# Patient Record
Sex: Male | Born: 1955 | Race: White | Hispanic: No | Marital: Married
Health system: Southern US, Academic
[De-identification: ages and names within clinical notes are randomized; demographics above are authoritative.]

## PROBLEM LIST (undated history)

## (undated) ENCOUNTER — Encounter: Attending: Internal Medicine | Primary: Internal Medicine

## (undated) ENCOUNTER — Telehealth

## (undated) ENCOUNTER — Ambulatory Visit: Payer: MEDICARE | Attending: Family | Primary: Family

## (undated) ENCOUNTER — Ambulatory Visit: Payer: MEDICARE

## (undated) ENCOUNTER — Encounter: Attending: Adult Health | Primary: Adult Health

## (undated) ENCOUNTER — Encounter: Attending: Infectious Disease | Primary: Infectious Disease

## (undated) ENCOUNTER — Encounter

## (undated) ENCOUNTER — Telehealth: Attending: Family | Primary: Family

## (undated) ENCOUNTER — Ambulatory Visit: Payer: MEDICARE | Attending: Internal Medicine | Primary: Internal Medicine

## (undated) ENCOUNTER — Telehealth: Attending: Registered" | Primary: Registered"

## (undated) ENCOUNTER — Ambulatory Visit: Payer: MEDICARE | Attending: Surgery | Primary: Surgery

## (undated) ENCOUNTER — Telehealth: Attending: Pulmonary Disease | Primary: Pulmonary Disease

## (undated) ENCOUNTER — Encounter: Attending: Pulmonary Disease | Primary: Pulmonary Disease

## (undated) ENCOUNTER — Ambulatory Visit: Payer: MEDICARE | Attending: Nephrology | Primary: Nephrology

## (undated) ENCOUNTER — Ambulatory Visit: Payer: MEDICARE | Attending: Clinical | Primary: Clinical

## (undated) ENCOUNTER — Ambulatory Visit: Payer: MEDICARE | Attending: MOHS-Micrographic Surgery | Primary: MOHS-Micrographic Surgery

## (undated) ENCOUNTER — Ambulatory Visit

## (undated) ENCOUNTER — Encounter: Attending: Family | Primary: Family

## (undated) ENCOUNTER — Telehealth: Attending: Adult Health | Primary: Adult Health

## (undated) ENCOUNTER — Ambulatory Visit: Payer: MEDICARE | Attending: Registered" | Primary: Registered"

## (undated) ENCOUNTER — Encounter
Attending: Student in an Organized Health Care Education/Training Program | Primary: Student in an Organized Health Care Education/Training Program

## (undated) ENCOUNTER — Telehealth: Attending: Clinical | Primary: Clinical

## (undated) ENCOUNTER — Telehealth: Attending: Internal Medicine | Primary: Internal Medicine

## (undated) ENCOUNTER — Encounter: Attending: MOHS-Micrographic Surgery | Primary: MOHS-Micrographic Surgery

## (undated) ENCOUNTER — Telehealth: Attending: Dermatology | Primary: Dermatology

## (undated) ENCOUNTER — Telehealth: Attending: Surgery | Primary: Surgery

## (undated) ENCOUNTER — Ambulatory Visit
Attending: Student in an Organized Health Care Education/Training Program | Primary: Student in an Organized Health Care Education/Training Program

## (undated) ENCOUNTER — Ambulatory Visit: Payer: MEDICARE | Attending: Dermatology | Primary: Dermatology

## (undated) ENCOUNTER — Telehealth
Attending: Student in an Organized Health Care Education/Training Program | Primary: Student in an Organized Health Care Education/Training Program

## (undated) ENCOUNTER — Encounter: Attending: Diagnostic Radiology | Primary: Diagnostic Radiology

## (undated) DIAGNOSIS — D649 Anemia, unspecified: Secondary | ICD-10-CM

## (undated) DIAGNOSIS — F419 Anxiety disorder, unspecified: Secondary | ICD-10-CM

## (undated) DIAGNOSIS — Z942 Lung transplant status: Secondary | ICD-10-CM

## (undated) DIAGNOSIS — F32A Depression, unspecified: Secondary | ICD-10-CM

## (undated) DIAGNOSIS — T7840XA Allergy, unspecified, initial encounter: Secondary | ICD-10-CM

## (undated) DIAGNOSIS — H269 Unspecified cataract: Secondary | ICD-10-CM

## (undated) DIAGNOSIS — Z5189 Encounter for other specified aftercare: Secondary | ICD-10-CM

## (undated) DIAGNOSIS — N189 Chronic kidney disease, unspecified: Secondary | ICD-10-CM

## (undated) DIAGNOSIS — I10 Essential (primary) hypertension: Secondary | ICD-10-CM

## (undated) DIAGNOSIS — K219 Gastro-esophageal reflux disease without esophagitis: Secondary | ICD-10-CM

## (undated) DIAGNOSIS — J449 Chronic obstructive pulmonary disease, unspecified: Secondary | ICD-10-CM

## (undated) HISTORY — DX: Anxiety disorder, unspecified: F41.9

## (undated) HISTORY — PX: LUNG TRANSPLANT: SHX234

## (undated) HISTORY — DX: Depression, unspecified: F32.A

## (undated) HISTORY — DX: Anemia, unspecified: D64.9

## (undated) HISTORY — DX: Chronic obstructive pulmonary disease, unspecified: J44.9

## (undated) HISTORY — DX: Chronic kidney disease, unspecified: N18.9

## (undated) HISTORY — DX: Essential (primary) hypertension: I10

## (undated) HISTORY — DX: Allergy, unspecified, initial encounter: T78.40XA

## (undated) HISTORY — PX: OTHER SURGICAL HISTORY: SHX169

## (undated) HISTORY — DX: Encounter for other specified aftercare: Z51.89

## (undated) HISTORY — DX: Lung transplant status: Z94.2

## (undated) HISTORY — DX: Gastro-esophageal reflux disease without esophagitis: K21.9

## (undated) HISTORY — DX: Unspecified cataract: H26.9

---
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
## 2012-02-11 ENCOUNTER — Ambulatory Visit: Payer: Self-pay | Admitting: Specialist

## 2012-03-03 ENCOUNTER — Ambulatory Visit: Payer: Self-pay | Admitting: Specialist

## 2012-03-22 ENCOUNTER — Ambulatory Visit: Payer: Self-pay | Admitting: Cardiothoracic Surgery

## 2012-04-26 ENCOUNTER — Ambulatory Visit: Payer: Self-pay | Admitting: Cardiothoracic Surgery

## 2012-10-31 ENCOUNTER — Encounter: Payer: Self-pay | Admitting: Pulmonary Disease

## 2012-11-02 ENCOUNTER — Other Ambulatory Visit: Payer: Self-pay | Admitting: Pulmonary Disease

## 2012-11-02 LAB — COMPREHENSIVE METABOLIC PANEL
Albumin: 3.3 g/dL — ABNORMAL LOW (ref 3.4–5.0)
Alkaline Phosphatase: 85 U/L (ref 50–136)
Anion Gap: 9 (ref 7–16)
Creatinine: 1.1 mg/dL (ref 0.60–1.30)
EGFR (African American): >60
EGFR (Non-African Amer.): >60
Glucose: 110 mg/dL — ABNORMAL HIGH (ref 65–99)
Potassium: 3.2 mmol/L — ABNORMAL LOW (ref 3.5–5.1)
SGOT(AST): 15 U/L (ref 15–37)
SGPT (ALT): 20 U/L (ref 12–78)
Sodium: 140 mmol/L (ref 136–145)

## 2012-11-02 LAB — CBC WITH DIFFERENTIAL/PLATELET
Basophil #: 0.1 10*3/uL (ref 0.0–0.1)
Basophil %: 1 %
Eosinophil #: 0.1 10*3/uL (ref 0.0–0.7)
Eosinophil %: 1.1 %
HCT: 46.9 % (ref 40.0–52.0)
Lymphocyte %: 22.6 %
MCHC: 34.5 g/dL (ref 32.0–36.0)
Monocyte %: 6.8 %
Neutrophil #: 7 10*3/uL — ABNORMAL HIGH (ref 1.4–6.5)
Neutrophil %: 68.5 %
Platelet: 259 10*3/uL (ref 150–440)
RBC: 4.69 10*6/uL (ref 4.40–5.90)
RDW: 15.1 % — ABNORMAL HIGH (ref 11.5–14.5)
WBC: 10.2 10*3/uL (ref 3.8–10.6)

## 2012-11-20 ENCOUNTER — Other Ambulatory Visit: Payer: Self-pay | Admitting: Pulmonary Disease

## 2012-11-20 ENCOUNTER — Encounter: Payer: Self-pay | Admitting: Pulmonary Disease

## 2012-11-27 LAB — COMPREHENSIVE METABOLIC PANEL WITH GFR
Albumin: 3.8 g/dL
Alkaline Phosphatase: 79 U/L
Anion Gap: 7
BUN: 10 mg/dL
Bilirubin,Total: 0.7 mg/dL
Calcium, Total: 9.7 mg/dL
Chloride: 105 mmol/L
Co2: 27 mmol/L
Creatinine: 1.01 mg/dL
EGFR (African American): >60
EGFR (Non-African Amer.): >60
Glucose: 109 mg/dL — ABNORMAL HIGH
Osmolality: 277
Potassium: 3.9 mmol/L
SGOT(AST): 19 U/L
SGPT (ALT): 27 U/L
Sodium: 139 mmol/L
Total Protein: 7.3 g/dL

## 2012-11-27 LAB — CBC WITH DIFFERENTIAL/PLATELET
Basophil #: 0.1 x10 3/mm 3
Basophil %: 0.5 %
Eosinophil #: 0 x10 3/mm 3
Eosinophil %: 0 %
HCT: 48.4 %
HGB: 16.6 g/dL
Lymphocyte %: 8.6 %
Lymphs Abs: 1.1 x10 3/mm 3
MCH: 34.2 pg — ABNORMAL HIGH
MCHC: 34.4 g/dL
MCV: 100 fL
Monocyte #: 0.3 "x10 3/mm "
Monocyte %: 2 %
Neutrophil #: 11.3 x10 3/mm 3 — ABNORMAL HIGH
Neutrophil %: 88.9 %
Platelet: 290 x10 3/mm 3
RBC: 4.85 x10 6/mm 3
RDW: 14.3 %
WBC: 12.7 x10 3/mm 3 — ABNORMAL HIGH

## 2012-12-19 LAB — COMPREHENSIVE METABOLIC PANEL
Anion Gap: 8 (ref 7–16)
BUN: 8 mg/dL (ref 7–18)
Bilirubin,Total: 0.6 mg/dL (ref 0.2–1.0)
Calcium, Total: 9.3 mg/dL (ref 8.5–10.1)
EGFR (Non-African Amer.): >60
Potassium: 3.9 mmol/L (ref 3.5–5.1)
SGOT(AST): 24 U/L (ref 15–37)
SGPT (ALT): 26 U/L (ref 12–78)
Sodium: 138 mmol/L (ref 136–145)
Total Protein: 7.5 g/dL (ref 6.4–8.2)

## 2012-12-19 LAB — CBC WITH DIFFERENTIAL/PLATELET
Basophil %: 0.8 %
Eosinophil #: 0 10*3/uL (ref 0.0–0.7)
Eosinophil %: 0 %
HGB: 16.2 g/dL (ref 13.0–18.0)
Lymphocyte %: 12.5 %
MCH: 34.2 pg — ABNORMAL HIGH (ref 26.0–34.0)
MCHC: 34.8 g/dL (ref 32.0–36.0)
Monocyte %: 4.2 %
Neutrophil #: 7.5 10*3/uL — ABNORMAL HIGH (ref 1.4–6.5)
Neutrophil %: 82.5 %
RBC: 4.73 10*6/uL (ref 4.40–5.90)
RDW: 13.7 % (ref 11.5–14.5)
WBC: 9.1 10*3/uL (ref 3.8–10.6)

## 2012-12-20 ENCOUNTER — Encounter: Payer: Self-pay | Admitting: Pulmonary Disease

## 2012-12-20 ENCOUNTER — Other Ambulatory Visit: Payer: Self-pay | Admitting: Pulmonary Disease

## 2013-01-20 ENCOUNTER — Encounter: Payer: Self-pay | Admitting: Pulmonary Disease

## 2013-02-19 ENCOUNTER — Encounter: Payer: Self-pay | Admitting: Pulmonary Disease

## 2013-03-22 ENCOUNTER — Encounter: Payer: Self-pay | Admitting: Pulmonary Disease

## 2013-04-22 ENCOUNTER — Encounter: Payer: Self-pay | Admitting: Pulmonary Disease

## 2013-05-20 ENCOUNTER — Encounter: Payer: Self-pay | Admitting: Pulmonary Disease

## 2013-06-20 ENCOUNTER — Encounter: Payer: Self-pay | Admitting: Pulmonary Disease

## 2013-07-20 ENCOUNTER — Encounter: Payer: Self-pay | Admitting: Pulmonary Disease

## 2013-08-20 ENCOUNTER — Encounter: Payer: Self-pay | Admitting: Pulmonary Disease

## 2013-09-19 ENCOUNTER — Encounter: Payer: Self-pay | Admitting: Pulmonary Disease

## 2013-10-20 ENCOUNTER — Encounter: Payer: Self-pay | Admitting: Pulmonary Disease

## 2013-11-20 ENCOUNTER — Encounter: Payer: Self-pay | Admitting: Pulmonary Disease

## 2013-12-14 ENCOUNTER — Ambulatory Visit: Payer: Self-pay | Admitting: Gastroenterology

## 2013-12-17 LAB — PATHOLOGY REPORT

## 2013-12-20 ENCOUNTER — Encounter: Payer: Self-pay | Admitting: Pulmonary Disease

## 2014-01-20 ENCOUNTER — Encounter: Payer: Self-pay | Admitting: Pulmonary Disease

## 2014-02-19 ENCOUNTER — Encounter: Payer: Self-pay | Admitting: Pulmonary Disease

## 2014-03-22 ENCOUNTER — Encounter: Payer: Self-pay | Admitting: Pulmonary Disease

## 2014-04-22 ENCOUNTER — Encounter: Payer: Self-pay | Admitting: Pulmonary Disease

## 2014-05-21 ENCOUNTER — Encounter
Admit: 2014-05-21 | Disposition: A | Discharge status: Home / Self Care | Payer: Self-pay | Attending: Pulmonary Disease | Admitting: Pulmonary Disease

## 2014-06-21 ENCOUNTER — Encounter
Admit: 2014-06-21 | Disposition: A | Discharge status: Home / Self Care | Payer: Self-pay | Attending: Pulmonary Disease | Admitting: Pulmonary Disease

## 2014-07-22 ENCOUNTER — Ambulatory Visit: Payer: Self-pay

## 2014-07-24 ENCOUNTER — Ambulatory Visit: Payer: Self-pay

## 2014-07-26 ENCOUNTER — Ambulatory Visit: Payer: Self-pay

## 2014-07-29 ENCOUNTER — Ambulatory Visit: Payer: Self-pay

## 2014-07-31 ENCOUNTER — Ambulatory Visit: Payer: Self-pay

## 2014-08-02 ENCOUNTER — Ambulatory Visit: Payer: Self-pay

## 2014-08-05 ENCOUNTER — Ambulatory Visit: Payer: Self-pay

## 2014-08-07 ENCOUNTER — Ambulatory Visit: Payer: Self-pay

## 2014-08-09 ENCOUNTER — Ambulatory Visit: Payer: Self-pay

## 2014-08-12 ENCOUNTER — Ambulatory Visit: Payer: Self-pay

## 2014-08-14 ENCOUNTER — Ambulatory Visit: Payer: Self-pay

## 2014-08-16 ENCOUNTER — Ambulatory Visit: Payer: Self-pay

## 2014-08-21 ENCOUNTER — Ambulatory Visit: Payer: Self-pay

## 2014-08-23 ENCOUNTER — Ambulatory Visit: Payer: Self-pay

## 2014-08-26 ENCOUNTER — Ambulatory Visit: Payer: Self-pay

## 2014-08-28 ENCOUNTER — Ambulatory Visit: Payer: Self-pay

## 2014-08-30 ENCOUNTER — Ambulatory Visit: Payer: Self-pay

## 2014-09-02 ENCOUNTER — Ambulatory Visit: Payer: Self-pay

## 2014-09-04 ENCOUNTER — Ambulatory Visit: Payer: Self-pay

## 2014-09-06 ENCOUNTER — Ambulatory Visit: Payer: Self-pay

## 2014-09-09 ENCOUNTER — Ambulatory Visit: Payer: Self-pay

## 2014-09-11 ENCOUNTER — Ambulatory Visit: Payer: Self-pay

## 2014-09-13 ENCOUNTER — Ambulatory Visit: Payer: Self-pay

## 2014-09-16 ENCOUNTER — Ambulatory Visit: Payer: Self-pay

## 2014-09-18 ENCOUNTER — Ambulatory Visit: Payer: Self-pay

## 2014-09-20 ENCOUNTER — Ambulatory Visit: Payer: Self-pay

## 2014-11-30 IMAGING — CT NM PET TUM IMG LTD AREA
1 of 5 series · 9 of 25 positions shown · non-contrast
Comparison: none

REASON FOR EXAM: Abnormal CT  Rt upper lobe nodule
COMMENTS:

[Series 3: ct wb 3.0 b30f · axial · 3.0mm · 0.98mm/px · z∈[-336,+358]mm · 9 of 435 slices shown]
[im 44/435  soft-tissue]
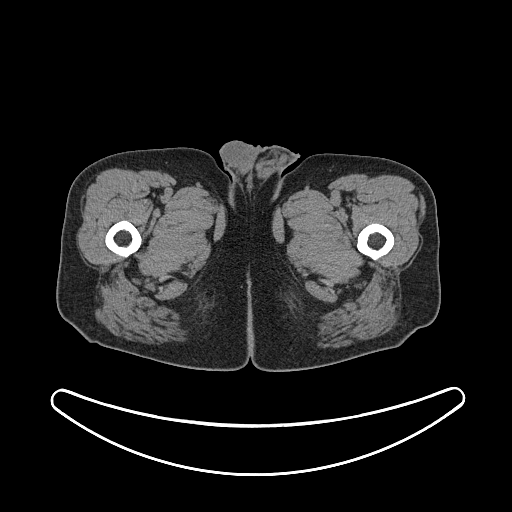
[im 87/435  soft-tissue]
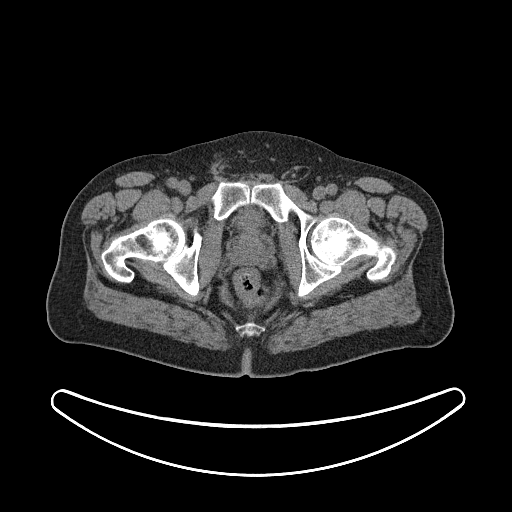
[im 131/435  soft-tissue]
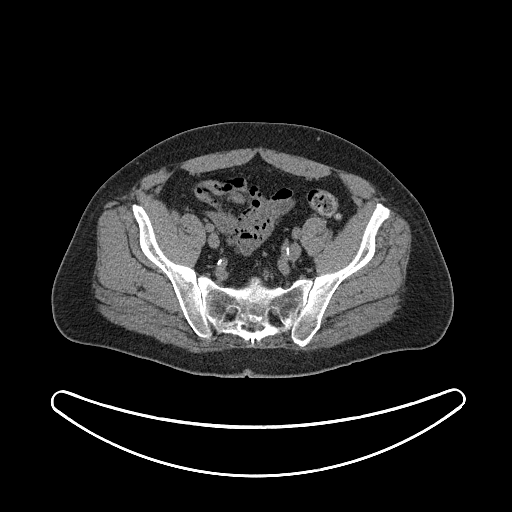
[im 174/435  soft-tissue]
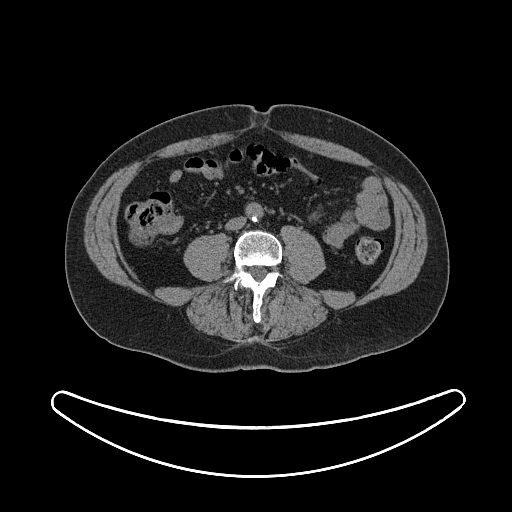
[im 218/435  soft-tissue]
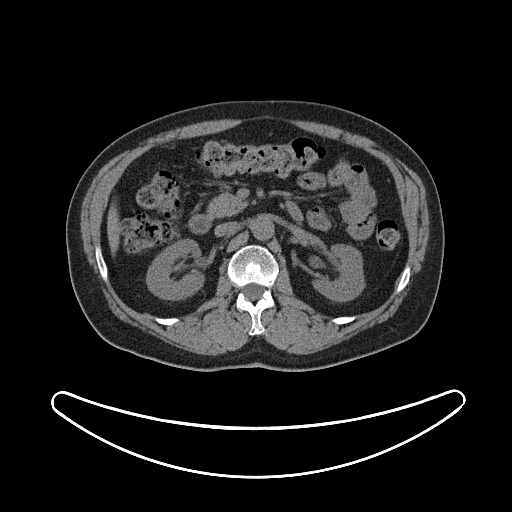
[im 261/435  soft-tissue]
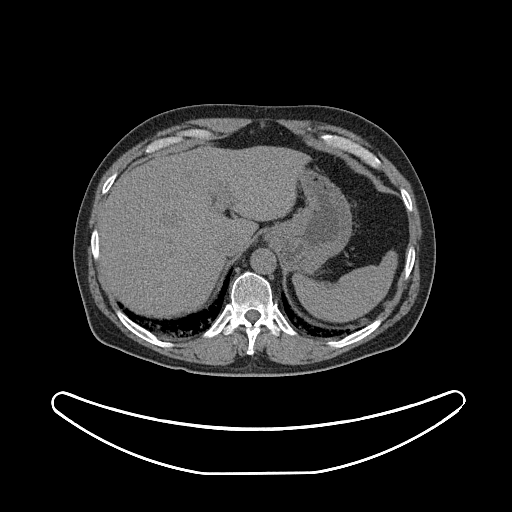
[im 304/435  soft-tissue]
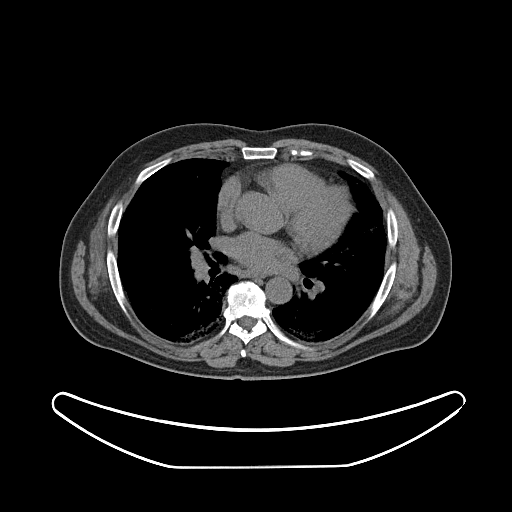
[im 348/435  soft-tissue]
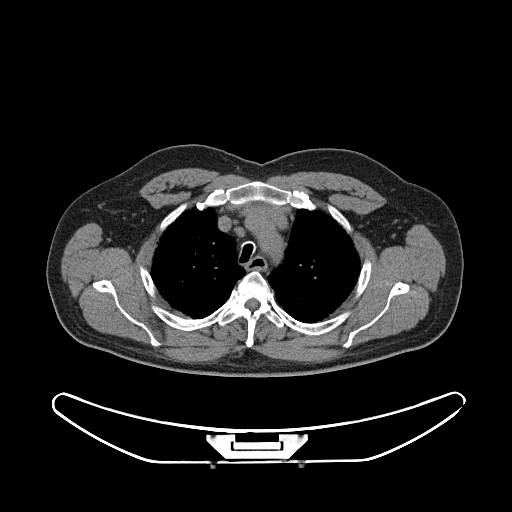
[im 391/435  soft-tissue]
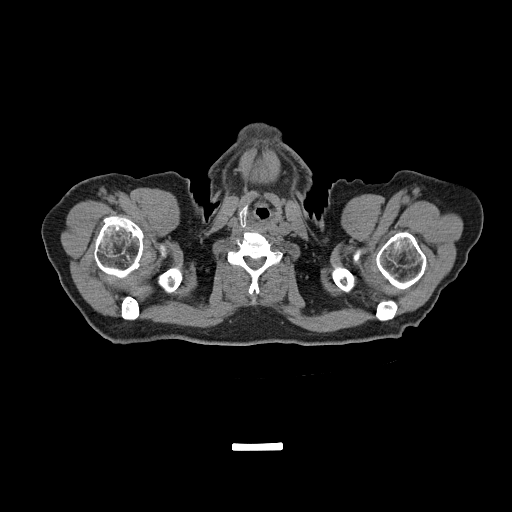

[9 of 25 positions shown; findings below may reference images not displayed]

PROCEDURE:     PET - PET/CT DX LUNG CA  - March 03, 2012 [DATE]

RESULT:

The patient has a fasting blood glucose level measuring 92 mg/dl. The
patient received a dose of 12.47 mCi of F18-FDG in the left antecubital
region at [DATE] a.m. with imaging obtained from the base of the brain into
the mid thighs between the hours of [DATE] a.m. and [DATE] a.m. A low dose
noncontrast CT is performed over the same area as the PET acquisition and is
utilized for attenuation correction and fusion. The low dose noncontrast CT,
attenuation corrected PET images and fused PET/CT data are reconstructed in
the axial, coronal and sagittal planes by the [employer] software. A rotating
3 dimensional maximum intensity pixel projection image is also created.

There is no previous exam for comparison.

The smoothly marginated area of pulmonary nodularity in the right upper lobe
shows an SUV of 2.13 maximum with a mean of 1.58 SUV similar to the
mediastinum. There is some increased localization in the subpleural right
lower lobe. The CT suggests areas of dependent atelectasis and/or fibrosis
at this level bilaterally. Atelectasis with some inflammation or early
infiltrate is felt to be most likely. The possibility of developing or
minimal malignancy in that region is not excluded. This area demonstrates a
maximum SUV of 6.31 with a mean of 3.9 SUV. There is focal increased
localization in the region of the arytenoid on the right with a maximum SUV
of 8.47 and a mean of 5.56. Correlate with direct visualization. GI and GU
localization appears normal.

Mediastinal adenopathy is present especially in the precarinal and AP window
region. No abnormal accumulation of F18-FDG is demonstrated.
IMPRESSION: 1.  No significant abnormal F18-FDG accumulation in the nodular density in
the right upper lobe. Focal abnormal increased localization in the
subpleural to pleural region of the right lower lobe which is nonspecific. A
definite accompanying mass is not appreciated. There is significant
underlying emphysematous lung disease and a focal infectious or inflammatory
etiology is not excluded. Correlate clinically. Follow-up CT evaluation is
recommended. A nonR8-FDG avid malignancy is not excluded.
2.  Abnormal focal localization in the right arytenoid region. Correlate
with direct visualization to assess for underlying malignant disease.
3.  Enlarged mediastinal lymph nodes especially in the precarinal and AP
window region. No abnormal F18-FDG accumulation evident.

[REDACTED]

## 2014-12-24 ENCOUNTER — Ambulatory Visit: Payer: BLUE CROSS/BLUE SHIELD

## 2016-09-23 MED ORDER — TACROLIMUS 1 MG CAPSULE
ORAL_CAPSULE | Freq: Two times a day (BID) | ORAL | 3 refills | 0.00000 days
Start: 2016-09-23 — End: 2016-10-08

## 2016-09-23 MED ORDER — TACROLIMUS 0.5 MG CAPSULE
ORAL_CAPSULE | Freq: Every day | ORAL | 3 refills | 0.00000 days | Status: CP
Start: 2016-09-23 — End: 2016-10-08

## 2016-10-04 ENCOUNTER — Ambulatory Visit: Admission: RE | Admit: 2016-10-04 | Discharge: 2016-10-04 | Payer: MEDICARE

## 2016-10-08 ENCOUNTER — Ambulatory Visit
Admission: RE | Admit: 2016-10-08 | Discharge: 2016-10-08 | Disposition: A | Discharge status: Home / Self Care | Payer: MEDICARE

## 2016-10-08 DIAGNOSIS — Z942 Lung transplant status: Principal | ICD-10-CM

## 2016-10-08 MED ORDER — TACROLIMUS 1 MG CAPSULE
ORAL_CAPSULE | Freq: Two times a day (BID) | ORAL | 3 refills | 0 days
Start: 2016-10-08 — End: 2016-10-15

## 2016-10-08 MED ORDER — GABAPENTIN 400 MG CAPSULE
ORAL_CAPSULE | 3 refills | 0 days | Status: CP
Start: 2016-10-08 — End: 2016-11-19

## 2016-10-08 MED ORDER — TACROLIMUS 0.5 MG CAPSULE
ORAL_CAPSULE | Freq: Two times a day (BID) | ORAL | 3 refills | 0 days
Start: 2016-10-08 — End: 2016-10-20

## 2016-10-14 ENCOUNTER — Ambulatory Visit
Admission: RE | Admit: 2016-10-14 | Discharge: 2016-10-14 | Disposition: A | Discharge status: Home / Self Care | Payer: MEDICARE

## 2016-10-14 DIAGNOSIS — Z942 Lung transplant status: Principal | ICD-10-CM

## 2016-10-15 MED ORDER — TACROLIMUS 1 MG CAPSULE
ORAL_CAPSULE | 3 refills | 0 days | Status: CP
Start: 2016-10-15 — End: 2016-10-20

## 2016-10-19 ENCOUNTER — Ambulatory Visit
Admission: RE | Admit: 2016-10-19 | Discharge: 2016-10-19 | Disposition: A | Discharge status: Home / Self Care | Payer: MEDICARE | Attending: Registered Nurse | Admitting: Registered Nurse

## 2016-10-19 ENCOUNTER — Ambulatory Visit
Admission: RE | Admit: 2016-10-19 | Discharge: 2016-10-19 | Disposition: A | Discharge status: Home / Self Care | Payer: MEDICARE | Attending: Ophthalmology | Admitting: Ophthalmology

## 2016-10-19 DIAGNOSIS — H2512 Age-related nuclear cataract, left eye: Principal | ICD-10-CM

## 2016-10-19 MED ORDER — PREDNISOLONE ACETATE 1 % EYE DROPS,SUSPENSION
Freq: Four times a day (QID) | OPHTHALMIC | 1 refills | 0 days | Status: CP
Start: 2016-10-19 — End: 2016-11-09

## 2016-10-19 MED ORDER — LEVOFLOXACIN 0.5 % EYE DROPS
Freq: Four times a day (QID) | OPHTHALMIC | 0 refills | 0.00000 days | Status: CP
Start: 2016-10-19 — End: 2016-10-29

## 2016-10-20 ENCOUNTER — Ambulatory Visit: Admission: RE | Admit: 2016-10-20 | Discharge: 2016-10-20 | Payer: MEDICARE

## 2016-10-20 DIAGNOSIS — H2513 Age-related nuclear cataract, bilateral: Principal | ICD-10-CM

## 2016-10-20 MED ORDER — TACROLIMUS 1 MG CAPSULE
ORAL_CAPSULE | 3 refills | 0 days | Status: CP
Start: 2016-10-20 — End: 2016-10-22

## 2016-10-20 MED ORDER — TACROLIMUS 0.5 MG CAPSULE
ORAL_CAPSULE | 3 refills | 0 days | Status: CP
Start: 2016-10-20 — End: 2016-10-26

## 2016-10-21 ENCOUNTER — Ambulatory Visit
Admission: RE | Admit: 2016-10-21 | Discharge: 2016-10-21 | Disposition: A | Discharge status: Home / Self Care | Payer: MEDICARE

## 2016-10-21 DIAGNOSIS — Z942 Lung transplant status: Principal | ICD-10-CM

## 2016-10-22 MED ORDER — TACROLIMUS 1 MG CAPSULE
ORAL_CAPSULE | Freq: Two times a day (BID) | ORAL | 3 refills | 0 days | Status: CP
Start: 2016-10-22 — End: 2016-11-18

## 2016-10-26 MED ORDER — TACROLIMUS 0.5 MG CAPSULE
ORAL_CAPSULE | 3 refills | 0 days | Status: CP
Start: 2016-10-26 — End: 2016-11-23

## 2016-10-28 ENCOUNTER — Ambulatory Visit
Admission: RE | Admit: 2016-10-28 | Discharge: 2016-10-28 | Disposition: A | Discharge status: Home / Self Care | Payer: MEDICARE

## 2016-10-28 DIAGNOSIS — Z942 Lung transplant status: Secondary | ICD-10-CM

## 2016-10-28 DIAGNOSIS — Z949 Transplanted organ and tissue status, unspecified: Principal | ICD-10-CM

## 2016-10-29 ENCOUNTER — Ambulatory Visit: Admission: RE | Admit: 2016-10-29 | Discharge: 2016-10-29 | Payer: MEDICARE

## 2016-10-29 DIAGNOSIS — H2512 Age-related nuclear cataract, left eye: Principal | ICD-10-CM

## 2016-11-04 ENCOUNTER — Ambulatory Visit
Admission: RE | Admit: 2016-11-04 | Discharge: 2016-11-04 | Disposition: A | Discharge status: Home / Self Care | Payer: MEDICARE

## 2016-11-04 DIAGNOSIS — Z942 Lung transplant status: Principal | ICD-10-CM

## 2016-11-09 ENCOUNTER — Ambulatory Visit
Admission: RE | Admit: 2016-11-09 | Discharge: 2016-11-09 | Disposition: A | Discharge status: Home / Self Care | Payer: MEDICARE | Attending: Pulmonary Disease | Admitting: Pulmonary Disease

## 2016-11-09 ENCOUNTER — Ambulatory Visit
Admission: RE | Admit: 2016-11-09 | Discharge: 2016-11-09 | Disposition: A | Discharge status: Home / Self Care | Payer: MEDICARE

## 2016-11-09 DIAGNOSIS — J984 Other disorders of lung: Principal | ICD-10-CM

## 2016-11-09 DIAGNOSIS — Z942 Lung transplant status: Principal | ICD-10-CM

## 2016-11-18 ENCOUNTER — Ambulatory Visit
Admission: RE | Admit: 2016-11-18 | Discharge: 2016-11-18 | Disposition: A | Discharge status: Home / Self Care | Payer: MEDICARE

## 2016-11-18 DIAGNOSIS — Z942 Lung transplant status: Principal | ICD-10-CM

## 2016-11-18 MED ORDER — TACROLIMUS 1 MG CAPSULE
ORAL_CAPSULE | 3 refills | 0 days
Start: 2016-11-18 — End: 2016-11-23

## 2016-11-18 MED ORDER — MYCOPHENOLATE MOFETIL 250 MG CAPSULE
ORAL_CAPSULE | Freq: Two times a day (BID) | ORAL | 3 refills | 0 days | Status: CP
Start: 2016-11-18 — End: 2016-11-19

## 2016-11-19 ENCOUNTER — Ambulatory Visit
Admission: RE | Admit: 2016-11-19 | Discharge: 2016-11-19 | Disposition: A | Discharge status: Home / Self Care | Payer: MEDICARE

## 2016-11-19 DIAGNOSIS — Z942 Lung transplant status: Principal | ICD-10-CM

## 2016-11-19 DIAGNOSIS — R7989 Other specified abnormal findings of blood chemistry: Principal | ICD-10-CM

## 2016-11-19 MED ORDER — GABAPENTIN 400 MG CAPSULE
ORAL_CAPSULE | Freq: Every evening | ORAL | 3 refills | 0 days | Status: CP
Start: 2016-11-19 — End: 2017-08-30

## 2016-11-19 MED ORDER — PREDNISONE 10 MG TABLET
ORAL_TABLET | Freq: Every day | ORAL | 3 refills | 0.00000 days | Status: CP
Start: 2016-11-19 — End: 2017-01-06

## 2016-11-19 MED ORDER — HYDROCHLOROTHIAZIDE 25 MG TABLET
ORAL_TABLET | Freq: Every day | ORAL | 3 refills | 0.00000 days | Status: CP
Start: 2016-11-19 — End: 2017-09-21

## 2016-11-19 MED ORDER — PRAVASTATIN 20 MG TABLET
ORAL_TABLET | 3 refills | 0 days | Status: CP
Start: 2016-11-19 — End: 2017-08-30

## 2016-11-19 MED ORDER — MAGNESIUM OXIDE 400 MG (241.3 MG MAGNESIUM) TABLET
ORAL_TABLET | Freq: Every day | ORAL | 1 refills | 0.00000 days
Start: 2016-11-19 — End: 2017-08-30

## 2016-11-19 MED ORDER — MYCOPHENOLATE MOFETIL 250 MG CAPSULE
ORAL_CAPSULE | Freq: Two times a day (BID) | ORAL | 3 refills | 0.00000 days | Status: CP
Start: 2016-11-19 — End: 2017-02-14

## 2016-11-19 MED ORDER — CHOLECALCIFEROL (VITAMIN D3) 25 MCG (1,000 UNIT) CAPSULE
ORAL_CAPSULE | Freq: Two times a day (BID) | ORAL | 11 refills | 0.00000 days
Start: 2016-11-19 — End: 2017-08-30

## 2016-11-19 MED ORDER — OMEPRAZOLE 40 MG CAPSULE,DELAYED RELEASE
ORAL_CAPSULE | Freq: Every day | ORAL | 3 refills | 0 days
Start: 2016-11-19 — End: 2017-03-28

## 2016-11-19 MED ORDER — AZITHROMYCIN 250 MG TABLET
ORAL_TABLET | Freq: Every day | ORAL | 3 refills | 0 days | Status: CP
Start: 2016-11-19 — End: 2017-07-26

## 2016-11-23 ENCOUNTER — Ambulatory Visit
Admission: RE | Admit: 2016-11-23 | Discharge: 2016-11-23 | Disposition: A | Discharge status: Home / Self Care | Payer: MEDICARE

## 2016-11-23 DIAGNOSIS — R7989 Other specified abnormal findings of blood chemistry: Principal | ICD-10-CM

## 2016-11-23 DIAGNOSIS — Z942 Lung transplant status: Secondary | ICD-10-CM

## 2016-11-23 MED ORDER — TACROLIMUS 1 MG CAPSULE
ORAL_CAPSULE | Freq: Two times a day (BID) | ORAL | 3 refills | 0 days
Start: 2016-11-23 — End: 2017-01-07

## 2016-11-23 MED ORDER — TACROLIMUS 0.5 MG CAPSULE
ORAL_CAPSULE | 3 refills | 0 days | Status: CP
Start: 2016-11-23 — End: 2017-01-07

## 2016-11-26 ENCOUNTER — Ambulatory Visit
Admission: RE | Admit: 2016-11-26 | Discharge: 2016-11-26 | Disposition: A | Discharge status: Home / Self Care | Payer: MEDICARE

## 2016-11-26 DIAGNOSIS — Z942 Lung transplant status: Principal | ICD-10-CM

## 2016-12-09 ENCOUNTER — Ambulatory Visit
Admission: RE | Admit: 2016-12-09 | Discharge: 2016-12-09 | Disposition: A | Discharge status: Home / Self Care | Payer: MEDICARE

## 2016-12-09 DIAGNOSIS — Z942 Lung transplant status: Principal | ICD-10-CM

## 2016-12-14 MED ORDER — METOPROLOL TARTRATE 25 MG TABLET
ORAL_TABLET | Freq: Two times a day (BID) | ORAL | 3 refills | 0.00000 days | Status: CP
Start: 2016-12-14 — End: 2017-12-03

## 2016-12-16 ENCOUNTER — Ambulatory Visit
Admission: RE | Admit: 2016-12-16 | Discharge: 2016-12-16 | Disposition: A | Discharge status: Home / Self Care | Payer: MEDICARE

## 2016-12-16 DIAGNOSIS — Z942 Lung transplant status: Principal | ICD-10-CM

## 2016-12-23 ENCOUNTER — Ambulatory Visit
Admission: RE | Admit: 2016-12-23 | Discharge: 2016-12-23 | Disposition: A | Discharge status: Home / Self Care | Payer: MEDICARE

## 2016-12-23 DIAGNOSIS — Z942 Lung transplant status: Principal | ICD-10-CM

## 2017-01-06 ENCOUNTER — Observation Stay
Admission: RE | Admit: 2017-01-06 | Discharge: 2017-01-06 | Disposition: A | Discharge status: Home / Self Care | Payer: MEDICARE

## 2017-01-06 DIAGNOSIS — Z942 Lung transplant status: Principal | ICD-10-CM

## 2017-01-07 MED ORDER — TACROLIMUS 1 MG CAPSULE: 1 mg | capsule | Freq: Two times a day (BID) | 3 refills | 0 days | Status: AC

## 2017-01-07 MED ORDER — TACROLIMUS 1 MG CAPSULE
ORAL_CAPSULE | Freq: Two times a day (BID) | ORAL | 3 refills | 0.00000 days | Status: CP
Start: 2017-01-07 — End: 2017-01-07

## 2017-01-07 MED ORDER — PREDNISONE 10 MG TABLET
ORAL_TABLET | Freq: Every day | ORAL | 3 refills | 0 days | Status: CP
Start: 2017-01-07 — End: 2017-07-26

## 2017-01-11 ENCOUNTER — Ambulatory Visit
Admission: RE | Admit: 2017-01-11 | Discharge: 2017-01-11 | Disposition: A | Discharge status: Home / Self Care | Payer: MEDICARE

## 2017-01-11 DIAGNOSIS — Z942 Lung transplant status: Principal | ICD-10-CM

## 2017-02-02 ENCOUNTER — Ambulatory Visit
Admission: RE | Admit: 2017-02-02 | Discharge: 2017-02-02 | Payer: MEDICARE | Attending: Dermatology | Admitting: Dermatology

## 2017-02-02 DIAGNOSIS — L821 Other seborrheic keratosis: Secondary | ICD-10-CM

## 2017-02-02 DIAGNOSIS — L570 Actinic keratosis: Principal | ICD-10-CM

## 2017-02-02 MED ORDER — FLUOROURACIL 5 % TOPICAL CREAM
1 refills | 0 days | Status: CP
Start: 2017-02-02 — End: 2017-08-25

## 2017-02-14 ENCOUNTER — Ambulatory Visit
Admission: RE | Admit: 2017-02-14 | Discharge: 2017-02-14 | Disposition: A | Discharge status: Home / Self Care | Payer: MEDICARE

## 2017-02-14 DIAGNOSIS — Z942 Lung transplant status: Principal | ICD-10-CM

## 2017-02-14 DIAGNOSIS — T86819 Unspecified complication of lung transplant: Secondary | ICD-10-CM

## 2017-02-14 DIAGNOSIS — R0602 Shortness of breath: Secondary | ICD-10-CM

## 2017-02-15 MED ORDER — MYCOPHENOLATE MOFETIL 250 MG CAPSULE
ORAL_CAPSULE | Freq: Two times a day (BID) | ORAL | 3 refills | 0 days | Status: CP
Start: 2017-02-15 — End: 2017-07-26

## 2017-03-16 ENCOUNTER — Ambulatory Visit
Admission: RE | Admit: 2017-03-16 | Discharge: 2017-03-16 | Disposition: A | Discharge status: Home / Self Care | Payer: MEDICARE

## 2017-03-16 DIAGNOSIS — Z942 Lung transplant status: Principal | ICD-10-CM

## 2017-03-24 ENCOUNTER — Ambulatory Visit: Admit: 2017-03-24 | Discharge: 2017-03-25 | Payer: MEDICARE

## 2017-03-24 DIAGNOSIS — Z942 Lung transplant status: Principal | ICD-10-CM

## 2017-03-28 MED ORDER — OMEPRAZOLE 40 MG CAPSULE,DELAYED RELEASE
ORAL_CAPSULE | Freq: Every day | ORAL | 3 refills | 0 days | Status: CP
Start: 2017-03-28 — End: 2017-06-13

## 2017-04-07 MED FILL — AZITHROMYCIN/250MG/TABS: AZITHROMYCIN/250MG/TABS | 90 days supply | Qty: 90 | Fill #1

## 2017-04-08 ENCOUNTER — Ambulatory Visit: Admit: 2017-04-08 | Discharge: 2017-04-08 | Disposition: A | Discharge status: Home / Self Care | Payer: MEDICARE

## 2017-04-08 ENCOUNTER — Emergency Department: Admit: 2017-04-08 | Discharge: 2017-04-08 | Disposition: A | Discharge status: Home / Self Care | Payer: MEDICARE

## 2017-04-08 DIAGNOSIS — Z942 Lung transplant status: Principal | ICD-10-CM

## 2017-04-08 MED ORDER — TACROLIMUS 1 MG CAPSULE
ORAL_CAPSULE | 3 refills | 0 days | Status: CP
Start: 2017-04-08 — End: 2017-07-26

## 2017-04-09 MED ORDER — AMOXICILLIN 875 MG-POTASSIUM CLAVULANATE 125 MG TABLET
ORAL_TABLET | Freq: Two times a day (BID) | ORAL | 0 refills | 0 days | Status: CP
Start: 2017-04-09 — End: 2017-04-16

## 2017-04-28 ENCOUNTER — Ambulatory Visit: Admit: 2017-04-28 | Discharge: 2017-04-29 | Payer: MEDICARE

## 2017-04-28 DIAGNOSIS — Z942 Lung transplant status: Principal | ICD-10-CM

## 2017-05-18 ENCOUNTER — Ambulatory Visit: Admit: 2017-05-18 | Discharge: 2017-05-19 | Payer: MEDICARE

## 2017-05-18 DIAGNOSIS — Z942 Lung transplant status: Principal | ICD-10-CM

## 2017-05-18 DIAGNOSIS — R7989 Other specified abnormal findings of blood chemistry: Secondary | ICD-10-CM

## 2017-06-13 MED ORDER — OMEPRAZOLE 40 MG CAPSULE,DELAYED RELEASE: 40 mg | capsule | Freq: Every day | 3 refills | 0 days | Status: AC

## 2017-06-13 MED ORDER — OMEPRAZOLE 40 MG CAPSULE,DELAYED RELEASE
ORAL_CAPSULE | Freq: Every day | ORAL | 3 refills | 0.00000 days | Status: CP
Start: 2017-06-13 — End: 2017-06-13

## 2017-06-13 MED ORDER — OMEPRAZOLE 40 MG CAPSULE,DELAYED RELEASE: capsule | 3 refills | 0 days

## 2017-06-16 ENCOUNTER — Ambulatory Visit: Admit: 2017-06-16 | Payer: MEDICARE

## 2017-06-16 MED ORDER — OMEPRAZOLE MAGNESIUM 20 MG TABLET,DELAYED RELEASE
ORAL_TABLET | Freq: Every evening | ORAL | 3 refills | 0.00000 days | Status: CP
Start: 2017-06-16 — End: 2017-08-30

## 2017-06-23 ENCOUNTER — Ambulatory Visit: Admit: 2017-06-23 | Discharge: 2017-06-24 | Payer: MEDICARE

## 2017-06-23 DIAGNOSIS — Z114 Encounter for screening for human immunodeficiency virus [HIV]: Secondary | ICD-10-CM

## 2017-06-23 DIAGNOSIS — Z01818 Encounter for other preprocedural examination: Secondary | ICD-10-CM

## 2017-06-23 DIAGNOSIS — Z7289 Other problems related to lifestyle: Secondary | ICD-10-CM

## 2017-06-23 DIAGNOSIS — N186 End stage renal disease: Principal | ICD-10-CM

## 2017-06-23 DIAGNOSIS — Z125 Encounter for screening for malignant neoplasm of prostate: Secondary | ICD-10-CM

## 2017-06-30 ENCOUNTER — Ambulatory Visit: Admit: 2017-06-30 | Discharge: 2017-07-01 | Payer: MEDICARE

## 2017-06-30 DIAGNOSIS — Z942 Lung transplant status: Principal | ICD-10-CM

## 2017-07-26 ENCOUNTER — Ambulatory Visit: Admit: 2017-07-26 | Discharge: 2017-07-27 | Payer: MEDICARE

## 2017-07-26 ENCOUNTER — Ambulatory Visit: Admit: 2017-07-26 | Discharge: 2017-07-27 | Payer: MEDICARE | Attending: Internal Medicine | Primary: Internal Medicine

## 2017-07-26 ENCOUNTER — Institutional Professional Consult (permissible substitution): Admit: 2017-07-26 | Discharge: 2017-07-27 | Payer: MEDICARE

## 2017-07-26 DIAGNOSIS — Z942 Lung transplant status: Principal | ICD-10-CM

## 2017-07-26 DIAGNOSIS — N189 Chronic kidney disease, unspecified: Secondary | ICD-10-CM

## 2017-07-26 DIAGNOSIS — R351 Nocturia: Principal | ICD-10-CM

## 2017-07-26 MED ORDER — AZITHROMYCIN 250 MG TABLET
ORAL_TABLET | Freq: Every day | ORAL | 3 refills | 0.00000 days | Status: CP
Start: 2017-07-26 — End: 2018-04-11

## 2017-07-26 MED ORDER — TACROLIMUS 0.5 MG CAPSULE: each | 0 refills | 0 days

## 2017-07-26 MED ORDER — TACROLIMUS 0.5 MG CAPSULE
ORAL | 0 refills | 0.00000 days | Status: CP
Start: 2017-07-26 — End: 2017-07-26

## 2017-07-26 MED ORDER — PREDNISONE 2.5 MG TABLET
ORAL_TABLET | Freq: Every day | ORAL | 3 refills | 0.00000 days | Status: CP
Start: 2017-07-26 — End: 2017-08-30

## 2017-07-26 MED ORDER — MYCOPHENOLATE MOFETIL 250 MG CAPSULE
ORAL_CAPSULE | Freq: Two times a day (BID) | ORAL | 3 refills | 0 days | Status: CP
Start: 2017-07-26 — End: 2017-08-01

## 2017-07-26 MED ORDER — SULFAMETHOXAZOLE 400 MG-TRIMETHOPRIM 80 MG TABLET
ORAL_TABLET | Freq: Every day | ORAL | 3 refills | 0.00000 days | Status: CP
Start: 2017-07-26 — End: 2017-07-26

## 2017-07-26 MED ORDER — AZITHROMYCIN 250 MG TABLET: 250 mg | tablet | Freq: Every day | 3 refills | 0 days | Status: AC

## 2017-07-26 MED ORDER — HYDRALAZINE 25 MG TABLET
ORAL_TABLET | Freq: Two times a day (BID) | ORAL | 11 refills | 0 days | Status: CP
Start: 2017-07-26 — End: 2017-08-30

## 2017-07-26 MED ORDER — FUROSEMIDE 20 MG TABLET
ORAL_TABLET | Freq: Every day | ORAL | 11 refills | 0.00000 days | Status: CP
Start: 2017-07-26 — End: 2017-08-30

## 2017-07-26 MED ORDER — SULFAMETHOXAZOLE 400 MG-TRIMETHOPRIM 80 MG TABLET: 1 | tablet | Freq: Every day | 3 refills | 0 days | Status: AC

## 2017-07-27 ENCOUNTER — Ambulatory Visit: Admit: 2017-07-27 | Discharge: 2017-07-28 | Payer: MEDICARE

## 2017-07-27 DIAGNOSIS — R351 Nocturia: Principal | ICD-10-CM

## 2017-07-27 DIAGNOSIS — R600 Localized edema: Secondary | ICD-10-CM

## 2017-07-27 MED ORDER — COMPRESSION STOCKING,THIGH HIGH,REGULAR LENGTH,MEDIUM CIRCUMFERENCE
Freq: Every day | 1 refills | 0 days | Status: CP
Start: 2017-07-27 — End: 2017-08-30

## 2017-07-27 MED ORDER — TAMSULOSIN 0.4 MG CAPSULE
ORAL_CAPSULE | Freq: Every evening | ORAL | 5 refills | 0 days | Status: CP
Start: 2017-07-27 — End: ?

## 2017-07-27 MED FILL — AZITHROMYCIN/250MG/TABS: AZITHROMYCIN/250MG/TABS | 90 days supply | Qty: 90 | Fill #0

## 2017-07-27 NOTE — Unmapped (Signed)
Referring Provider:  Dr. Cliffton Asters    PCP:   Dr. Dudley Major    Reason for Visit:  Nocturia    HPI:   Angel Nguyen  is a pleasant 62 y.o. year-old male who is being seen today in consultation at the request of Dr. Cliffton Asters for nocturia.    He reports having to urinate every night at 1am, 2:30am, 3:30am and 4:30am.  He reports that he urinates a small amounts of urine at night.  During the day he does not uriate a lot.  He is a bilateral lung transplant.  He drinks 2 bottles of water before work with 1 cup of coffee, 2 bottles of water at work, 3-4 botlles of water when he gets home between the afternoon and evening.  His fist urinate after his 6am urination is around 12:30pm.  He rarely drinks tea and has a soda every 2-3 days.  He goes to bed at 10pm.  He drinks up until he goes to bed and reports drinking a lot when he takes his Tacrolimus before bed.  He takes this at 9pm and 9 am.  He also takes his other night time medication at 9pm.  He takes HCTZ in the morning.  He has not started Lasix but starts it in the morning starting Monday.  He has minor swelling that started over the past couple of months.  He does not wear compression stocking.  He was told by his transplant team to drink a lot of water.        Fluids type: mostly water with 1 cup of coffee daily  Fluid volume: 112-132 ounces of water a day      IPSS:    Incomplete Emptying: 0  Frequency: 2  Intermittency: 0  Urgency: 1  Weak stream: 0  Straining: 0  Nocturia: 4    Total: 7  (0-7 mild, 8-19 moderate, 20-35 severe)    QOL: 5    PSA:  Lab Results   Component Value Date/Time    PSA 0.67 06/23/2017 08:50 AM    PSA 0.46 10/18/2014 02:00 PM       Past medical history:  Past Medical History:   Diagnosis Date   ??? Anisocoria    ??? Colon polyps     removed   ??? COPD (chronic obstructive pulmonary disease) (CMS-HCC)    ??? GERD (gastroesophageal reflux disease)    ??? Hypertension    ??? Interstitial lung disease (CMS-HCC)    ??? NSIP (nonspecific interstitial pneumonia) (CMS-HCC) Past surgical history:  Past Surgical History:   Procedure Laterality Date   ??? BACK SURGERY  2005    discectomy    ??? BARIUM SWALLOW W CINE [facility] HISTORICAL RESULT)     ??? CARDIAC CATHETERIZATION      patients states normal   ??? CATARACT EXTRACTION Right 02/17/2016    pcIOL    ??? COLONOSCOPY W/ POLYPECTOMY     ??? ESOPHAGOGASTRODUODENOSCOPY     ??? EYE SURGERY Bilateral 2005    lasik   ??? HERNIA REPAIR Right     ingunial    ??? LASIK Bilateral 2005    Dr Nile Riggs   ??? LUNG BIOPSY     ??? LUNG TRANSPLANT, DOUBLE  Sept 2016   ??? PR BRONCHOSCOPY,DIAGNOSTIC W LAVAGE Bilateral 12/24/2014    Procedure: BRONCHOSCOPY, RIGID OR FLEXIBLE, INCLUDE FLUOROSCOPIC GUIDANCE WHEN PERFORMED; W/BRONCHIAL ALVEOLAR LAVAGE With Moderate Sedation;  Surgeon: Anson Fret, MD;  Location: BRONCH PROCEDURE LAB [facility];  Service: Pulmonary   ???  PR BRONCHOSCOPY,DIAGNOSTIC W LAVAGE Bilateral 01/30/2015    Procedure: BRONCHOSCOPY, RIGID OR FLEXIBLE, INCLUDE FLUOROSCOPIC GUIDANCE WHEN PERFORMED; W/BRONCHIAL ALVEOLAR LAVAGE With Moderate Sedation.;  Surgeon: Anson Fret, MD;  Location: BRONCH PROCEDURE LAB [facility];  Service: Pulmonary   ??? PR BRONCHOSCOPY,DIAGNOSTIC W LAVAGE Bilateral 05/13/2015    Procedure: BRONCHOSCOPY, RIGID OR FLEXIBLE, INCLUDE FLUOROSCOPIC GUIDANCE WHEN PERFORMED; W/BRONCHIAL ALVEOLAR LAVAGE With Moderate Sedation;  Surgeon: Jamesetta Geralds, MD;  Location: BRONCH PROCEDURE LAB [facility];  Service: Pulmonary   ??? PR BRONCHOSCOPY,DIAGNOSTIC W LAVAGE Bilateral 09/24/2015    Procedure: BRONCHOSCOPY, FLEXIBLE, INCLUDE FLUOROSCOPIC GUIDANCE WHEN PERFORMED; W/BRONCHIAL ALVEOLAR LAVAGE WITH MODERATE SEDATION;  Surgeon: Jamesetta Geralds, MD;  Location: BRONCH PROCEDURE LAB [facility];  Service: Pulmonary   ??? PR BRONCHOSCOPY,DIAGNOSTIC W LAVAGE Bilateral 04/13/2016    Procedure: BRONCHOSCOPY, RIGID OR FLEXIBLE, INCLUDE FLUOROSCOPIC GUIDANCE WHEN PERFORMED; W/BRONCHIAL ALVEOLAR LAVAGE WITH MODERATE SEDATION WITH MODERATE SEDATION;  Surgeon: Jamesetta Geralds, MD;  Location: BRONCH PROCEDURE LAB [facility];  Service: Pulmonary   ??? PR BRONCHOSCOPY,DIAGNOSTIC W LAVAGE Bilateral 06/04/2016    Procedure: BRONCHOSCOPY, RIGID OR FLEXIBLE, INCLUDE FLUOROSCOPIC GUIDANCE WHEN PERFORMED; W/BRONCHIAL ALVEOLAR LAVAGE WITH MODERATE SEDATION;  Surgeon: Jamesetta Geralds, MD;  Location: BRONCH PROCEDURE LAB [facility];  Service: Pulmonary   ??? PR BRONCHOSCOPY,TRANSBRONCH BIOPSY N/A 12/24/2014    Procedure: BRONCHOSCOPY, RIGID/FLEXIBLE, INCLUDE FLUORO GUIDANCE WHEN PERFORMED; W/TRANSBRONCHIAL LUNG BX, SINGLE LOBE With Moderate Sedation;  Surgeon: Anson Fret, MD;  Location: BRONCH PROCEDURE LAB [facility];  Service: Pulmonary   ??? PR BRONCHOSCOPY,TRANSBRONCH BIOPSY N/A 01/30/2015    Procedure: BRONCHOSCOPY, RIGID/FLEXIBLE, INCLUDE FLUORO GUIDANCE WHEN PERFORMED; W/TRANSBRONCHIAL LUNG BX, SINGLE LOBE With Moderate Sedation.;  Surgeon: Anson Fret, MD;  Location: BRONCH PROCEDURE LAB [facility];  Service: Pulmonary   ??? PR BRONCHOSCOPY,TRANSBRONCH BIOPSY N/A 05/13/2015    Procedure: BRONCHOSCOPY, RIGID/FLEXIBLE, INCLUDE FLUORO GUIDANCE WHEN PERFORMED; W/TRANSBRONCHIAL LUNG BX, SINGLE LOBE With Moderate Sedation;  Surgeon: Jamesetta Geralds, MD;  Location: BRONCH PROCEDURE LAB [facility];  Service: Pulmonary   ??? PR BRONCHOSCOPY,TRANSBRONCH BIOPSY N/A 09/24/2015    Procedure: BRONCHOSCOPY, FLEXIBLE, INCLUDE FLUORO GUIDANCE WHEN PERFORMED; W/TRANSBRONCHIAL LUNG BX, SINGLE LOBE WITH MODERATE SEDATION;  Surgeon: Jamesetta Geralds, MD;  Location: BRONCH PROCEDURE LAB [facility];  Service: Pulmonary   ??? PR BRONCHOSCOPY,TRANSBRONCH BIOPSY N/A 04/13/2016    Procedure: BRONCHOSCOPY, RIGID/FLEXIBLE, INCLUDE FLUORO GUIDANCE WHEN PERFORMED; W/TRANSBRONCHIAL LUNG BX, SINGLE LOBE;  Surgeon: Jamesetta Geralds, MD;  Location: BRONCH PROCEDURE LAB [facility];  Service: Pulmonary   ??? PR BRONCHOSCOPY,TRANSBRONCH BIOPSY N/A 06/04/2016    Procedure: BRONCHOSCOPY, RIGID/FLEXIBLE, INCLUDE FLUORO GUIDANCE WHEN PERFORMED; W/TRANSBRONCHIAL LUNG BX, SINGLE LOBE WITH MODERATE SEDATION;  Surgeon: Jamesetta Geralds, MD;  Location: BRONCH PROCEDURE LAB [facility];  Service: Pulmonary   ??? PR ESOPHAGEAL MOTILITY STUDY, MANOMETRY N/A 02/21/2014    Procedure: ESOPHAGEAL MOTILITY STUDY W/INT & REP;  Surgeon: Nurse-Based Giproc;  Location: GI PROCEDURES MEMORIAL [facility];  Service: Gastroenterology   ??? PR ESOPHAGEAL MOTILITY STUDY, MANOMETRY N/A 06/12/2015    Procedure: ESOPHAGEAL MOTILITY STUDY W/INT & REP;  Surgeon: Nurse-Based Giproc;  Location: GI PROCEDURES MEMORIAL [facility];  Service: Gastroenterology   ??? PR GERD TST W/ MUCOS IMPEDE ELECTROD,>1HR N/A 02/21/2014    Procedure: ESOPHAGEAL FUNCTION TEST, GASTROESOPHAGEAL REFLUX TEST W/ NASAL CATHETER INTRALUMINAL IMPEDANCE ELECTRODE(S) PLACEMENT, RECORDING, ANALYSIS AND INTERPRETATION; PROLONGED;  Surgeon: Nurse-Based Giproc;  Location: GI PROCEDURES MEMORIAL [facility];  Service: Gastroenterology   ??? PR GERD TST W/ NASAL IMPEDENCE ELECTROD N/A 06/12/2015    Procedure: ESOPH FUNCT TST NASL ELEC PLCMT;  Surgeon: Nurse-Based  Giproc;  Location: GI PROCEDURES MEMORIAL [facility];  Service: Gastroenterology   ??? PR GERD TST W/ NASAL PH ELECTROD N/A 06/12/2015    Procedure: ESOPHAGEAL 24 HOUR PH MONITORING;  Surgeon: Nurse-Based Giproc;  Location: GI PROCEDURES MEMORIAL [facility];  Service: Gastroenterology   ??? PR GERD TST W/ NASAL PH ELECTROD N/A 06/19/2015    Procedure: ESOPHAGEAL 24 HOUR PH MONITORING;  Surgeon: Nurse-Based Giproc;  Location: GI PROCEDURES MEMORIAL [facility];  Service: Gastroenterology   ??? PR LAP, REPAIR PARAESOPHAGEAL HERNIA, INCL FUNDOPLASTY W/O MESH Midline 08/28/2015    Procedure: LAPAROSCOPY, SURGICAL, REPAIR PARAESOPHAGEAL HERNIA, INCLUDE FUNDOPLASTY, WHEN PERFORMED; W/O MESH IMPLANT;  Surgeon: Felton Clinton, MD;  Location: MAIN OR [place];  Service: Gastrointestinal   ??? PR LUNG TRANSPLANT,DBL W CP BYPASS Bilateral 11/26/2014    Procedure: LUNG TRANSPL DBL; Ella Jubilee BYPASS;  Surgeon: Evert Kohl, MD;  Location: MAIN OR [facility]; Service: Cardiothoracic   ??? PR REMV CATARACT EXTRACAP,INSERT LENS Right 02/17/2016    Procedure: EXTRACAPSULAR CATARACT REMOVAL W/INSERTION OF INTRAOCULAR LENS PROSTHESIS, MANUAL OR MECHANICAL TECHNIQUE;  Surgeon: Earlie Counts, MD;  Location: [facility] OR [facility];  Service: Ophthalmology   ??? PR REMV CATARACT EXTRACAP,INSERT LENS Left 10/19/2016    Procedure: EXTRACAPSULAR CATARACT REMOVAL W/INSERTION OF INTRAOCULAR LENS PROSTHESIS, MANUAL OR MECHANICAL TECHNIQUE;  Surgeon: Earlie Counts, MD;  Location: [facility] OR [facility];  Service: Ophthalmology   ??? PR UPPER GI ENDOSCOPY,DIAGNOSIS N/A 07/30/2015    Procedure: UGI ENDO, INCLUDE ESOPHAGUS, STOMACH, & DUODENUM &/OR JEJUNUM; DX W/WO COLLECTION SPECIMN, BY BRUSH OR WASH;  Surgeon: Zetta Bills, MD;  Location: GI PROCEDURES MEMORIAL [facility];  Service: Gastroenterology   ??? 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  He is a [place] citizen.  He states he grew up around asbestos and worked with it in [employer].  He smoked 30 years, 1ppd, quit in 2009. He lives with wife, has 2 children.  He drinks 4 alcoholic drinks a week.  He denies illicit drug use.       Family history:  family history includes Breast cancer in his mother; Cancer in his paternal grandmother; Cerebral aneurysm in his father; Glaucoma in his paternal grandmother; Kidney failure in his mother; No Known Problems in his brother, maternal aunt, maternal grandfather, maternal grandmother, maternal uncle, paternal aunt, paternal grandfather, paternal uncle, and sister.    MEDICATIONS:   Current Outpatient Medications   Medication Sig Dispense Refill   ??? aspirin (ECOTRIN) 81 MG tablet Take 81 mg by mouth daily.     ??? azithromycin (ZITHROMAX) 250 MG tablet Take 1 tablet (250 mg total) by mouth daily. 90 tablet 3   ??? cholecalciferol, vitamin D3, (VITAMIN D3) 1,000 unit capsule Take 1 capsule (1,000 Units total) by mouth Two (2) times a day. 60 capsule 11   ??? furosemide (LASIX) 20 MG tablet Take 2 tablets (40 mg total) by mouth daily. 30 tablet 11   ??? hydrALAZINE (APRESOLINE) 25 MG tablet Take 1 tablet (25 mg total) by mouth Two (2) times a day. 60 tablet 11   ??? hydroCHLOROthiazide (HYDRODIURIL) 25 MG tablet Take 1 tablet (25 mg total) by mouth daily. 90 tablet 3   ??? magnesium oxide (MAGOX) 400 mg tablet Take 1 tablet (400 mg total) by mouth daily.  200 tablet 1   ??? metoprolol tartrate (LOPRESSOR) 25 MG tablet Take 1 tablet (25 mg total) by mouth Two (2) times a day. (Patient taking differently: Take 25 mg by mouth daily. ) 180 tablet 3   ??? mycophenolate (CELLCEPT) 250 mg capsule Take 2 capsules (500 mg total) by mouth Two (2) times a day. 180 capsule 3   ??? omeprazole (PRILOSEC OTC) 20 MG tablet Take 1 tablet (20 mg total) by mouth nightly. 90 tablet 3   ??? omeprazole (PRILOSEC) 40 MG capsule Take 1 capsule (40 mg total) by mouth daily. 90 capsule 3   ??? pravastatin (PRAVACHOL) 20 MG tablet Take one tablet with evening  meal 90 tablet 3   ??? predniSONE (DELTASONE) 2.5 MG tablet Take 3 tablets (7.5 mg total) by mouth daily. 270 tablet 3   ??? sulfamethoxazole-trimethoprim (BACTRIM,SEPTRA) 400-80 mg per tablet Take 1 tablet (80 mg of trimethoprim total) by mouth daily. 90 tablet 3   ??? tacrolimus (PROGRAF) 0.5 MG capsule Take 2 capsules (1mg ) in the AM and 1 capsule (0.5mg ) in the PM. 112 capsule 0   ??? compres.stocking,thigh,reg,med Misc 2 Product by Miscellaneous route daily. 2 each 1   ??? fluorouracil (EFUDEX) 5 % cream Apply to right cheek twice daily for 4 weeks (Patient not taking: Reported on 07/27/2017) 40 g 1   ??? gabapentin (NEURONTIN) 400 MG capsule Take 2 capsules (800 mg total) by mouth nightly. 180 capsule 3   ??? tamsulosin (FLOMAX) 0.4 mg capsule Take 1 capsule (0.4 mg total) by mouth nightly. 30 capsule 5     No current facility-administered medications for this visit.        Allergies:  Allergies   Allergen Reactions   ??? Nsaids (Non-Steroidal Anti-Inflammatory Drug) Other (See Comments)     CKD    ??? Shrimp Hives   ??? Lisinopril Cough     Other reaction(s): Cough   ??? Losartan Cough     Other reaction(s): Cough   ??? Other      bp meds, pt unsure of names, caused bad cough       Review of Systems:  Positive for fatigue, leg swelling, frequent urination at night, and ED.  Otherwise, a 10 system ROS questionnaire completed by the patient and reviewed by me was normal.    Physical Exam  Vitals:    07/27/17 0957   BP: 149/93   Pulse: 66   Temp: 36.5 ??C (97.7 ??F)   TempSrc: Oral   Weight: (!) 105.2 kg (232 lb)   Height: 177.8 cm (5' 10)     GENERAL: The patient is a pleasant, male in no acute distress.   HEENT: Normocephalic and atraumatic. Extraocular movements are intact.   PULMONARY: Relaxed respiratory effort on room air.   CARDIOVASCULAR: No edema.   GENITOURINARY:  Circumcised penis with bilaterally descended testicles. No testicular masses or tenderness.  Digital rectal exam reveals a moderatly enlarged prostate that is smooth and symmetric without nodules or tenderness.    SKIN: No signs of cyanosis or clubbing.   NEUROLOGICAL: Grossly intact.   PSYCH: Alert and oriented x 3.     Labs:  Results for orders placed or performed during the hospital encounter of 07/26/17   Flow volume loop   Result Value Ref Range    FEV1 PRE 2.86 2.76213 - 4.34063 L    FEV1/FVC PRE 79.79 65.5788 - 84.9348 %    FVC PRE 3.58 (L) 3.78515 - 5.65261 L    PEF  PRE 6.49 (L) 1.61096 - 11.41035 L/s    Vol extrap pre 0.03 L    FIVC PRE 3.63 (L) 3.78515 - 5.65261 L    PIF PRE 5.14 (H) 3.69107 - 3.69107 L/s    FIF50% PRED 4.89 2.5624 - 6.7736 L/s    FEV6 PRE 3.53 (L) 3.58588 - 5.40138 L    FEV1/FEV6 PRE 80.81 69.8036 - 87.7396 %    FEF50% PRE 3.72 L/s    FEF25-75% PRE 2.74 1.28105 - 4.48177 L/s    EAV/WUJ81 pre 76.08 %    ISOFEF25-75 PRE 2.74 L/s    FET100% Change 6.98 sec       PVR:  3cc    Assessment:  1. Nocturia  Ambulatory referral to Urology    Bladder scan   2. Localized edema         Plan:  1.  We do long detailed discussion concerning his nocturia and causes including BPH, overactive bladder, medications, fluid intake before bed, hormone changes, peripheral edema and sleep disturbances.  We discussed treatment options including medications for BPH, specifically alpha blockers as well as other side effects.  We also discussed lifestyle modifications, specifically avoiding eating drinking large amounts of fluids at least an hour to 2 hours before bed.  We discussed taking his diuretic medications first thing in the morning and wearing compression stockings to help with his peripheral edema.  I did not discuss a sleep study but he may benefit from this given his BMI.  At this time he will start tamsulosin 0.4 mg nightly, use compression stockings during the day, avoid drinking large amounts of liquids right before bed and take his diuretics first thing in the morning.  He will also do a bladder diary for further evaluation of his symptoms.  He will follow-up with me in 8 weeks.  All questions were answered and he agreed with the plan.  At his follow-up I will discuss a sleep study in further detail.

## 2017-07-27 NOTE — Unmapped (Signed)
Please use this form to record, as accurately as possible, your fluid intake and output for 3 days.  Please choose days that show your typical bladder activity and remember to bring your completed record to your appointment.  THIS RECORD IS VERY IMPORTANT AND MAY BE CRITICAL IN HELPING YOUR PROVIDER ACCURATELY DIAGNOSE AND MANAGE YOUR CONDITION!    Record drinking, emptying and leaking each time they occur. Record in the ???cath volume???column if you empty by catheterizing.  Leave that column blank if you do not do this.  If you urinate then catheterize for residual, record that amount in the appropriate column.      Fluid intake: The amount of fluid you drink. Measure your cups, glasses &/or mugs before you start that you will know how much they hold. Record the amount & type of fluid consumed.    Urine passed: Measure either using a urinal or urine hat given to you at your appointment OR in any container you have at home that can measure in ounces or milliliters (mL).  A guide to converting ounces to mL is below.    Cath Volume: Record amount and times just as if you voided normally. If you catheterize for residual, record the times and amounts of that as well.    Leakage/Wetness: Record each leakage event and anything special about it, such as coughing, running, strong urge you couldn???t stop, etc???  Indicate how much you leak by writing 1+, 2+, 3+, etc.  A small leak is 1+, a large leak is 3+, etc..      1 oz = 30mL  8 oz =  16oz =  24 oz =  2 oz = 60mL  10 oz = 18 oz = 26 oz =  4 oz =  12 oz = 20 oz = 28 oz =  6 oz =   14oz =  22 oz = 30 0z =          EXAMPLE    Date Time Fluid Intake  mL   Urine Passed  mL Cath Volume  mL Leakage / Wetness Comments   4/26 6:30am       4/26 7:30am 6 oz juice       4/26 9:00am 12 oz water       4/26 12:00   1+ cough               Date Time Fluid Intake  mL   Urine Passed  mL Cath Volume mL Leakage / Wetness Comments  Date Time Fluid Intake  mL   Urine Passed  mL Cath Volume  mL Leakage / Wetness Comments

## 2017-07-27 NOTE — Unmapped (Signed)
Called the patient to set up kidney orientation and evaluation appointments.  Called the patient and he is aware and in agreement of with the times and dates.

## 2017-07-28 LAB — EBV VIRAL LOAD RESULT: Lab: NOT DETECTED

## 2017-07-28 MED FILL — SULFAMETHOXAZOLE/TRIMETHO/400-80MG/TABS: SULFAMETHOXAZOLE/TRIMETHO/400-80MG/TABS | 90 days supply | Qty: 90 | Fill #0

## 2017-07-28 NOTE — Unmapped (Signed)
Patient had blood drawn in Phlebotomy.

## 2017-07-28 NOTE — Unmapped (Unsigned)
[facility] Medication Referral: [place] APPROVED    Medication (Brand/Generic): TACROLIMUS 0.5MG     Initial FSI Test Claim completed with resulted information below:  No PA required  Patient ABLE to fill at [facility]  Insurance Company:  [place] APPROVED FOR PART B  Anticipated Copay: $0  Is anticipated copay with a copay card or grant? NO    As Co-pay is under $100 defined limit, per policy there will be no further investigation of need for financial assistance at this time unless patient requests. This referral has been communicated to the provider and handed off to the [facility] team for further processing and filling of prescribed medication.   ______________________________________________________________________  Please utilize this referral for viewing purposes as it will serve as the central location for all relevant documentation and updates.

## 2017-07-28 NOTE — Unmapped (Signed)
[facility]   Patient Onboarding/Medication Counseling    Mr.Condron is a 62 y.o. male with hx lung transplant who I am counseling today on initiation of therapy. Patient has been receiving med for many years [facility], just switching to [place] now. Declined education today. This rx was sent to [place] by mistake (per patient), but since it appears Part B PA was done (routing message said $0, my test claim said $15, patient ok with either), patient will get from [place]. I will notify coordinator that patient will be getting from [place], and if labs need to be done (possible manuf change, patient not sure)    Medication: tacrolimus 0.5mg     Verified patient's date of birth / HIPAA.      Education Provided: ??    Dose/Administration discussed: 2 capsules (1mg ) in the am and 1 capsules (0.5mg ) in the pm. Patient declined education.     Storage requirements:  Patient declined education.    Side effects / precautions discussed:  Patient declined education.  Patient will receive a drug information handout with shipment.    Handling precautions / disposal reviewed:   Patient declined education..    Drug Interactions: other medications reviewed and up to date in Epic.  No drug interactions identified. Patient declined education.    Comorbidities/Allergies: reviewed and up to date in Epic.    Verified therapy is appropriate and should continue      Delivery Information    Medication Assistance provided: Prior Authorization    Anticipated copay of $0 or $15 (see note above) reviewed with patient. Verified delivery address in FSI and reviewed medication storage requirement.    Scheduled delivery date: 08/01/17 via same day [facility] (patient will only be home between 2 and 8pm)    Explained that we ship using UPS or courier and this shipment will require a signature.      Explained the services we provide at [facility] and that each month we would call to set up refills.  Stressed importance of returning phone calls so that we could ensure they receive their medications in time each month.  Informed patient that we should be setting up refills 7-10 days prior to when they will run out of medication.  Informed patient that welcome packet will be sent.      Patient verbalized understanding of the above information as well as how to contact the pharmacy at [phone] option 4 with any questions/concerns.  The pharmacy is open Monday through Friday 8:30am-4:30pm.  A pharmacist is available 24/7 via pager to answer any clinical questions they may have.        Patient Specific Needs      ? Patient has no physical, cognitive, or cultural barriers.    ? Patient prefers to have medications discussed with  Patient     ? Patient is able to read and understand education materials at a high school level or above.    ? Patient's primary language is  English           Thad Ranger  [facility] Specialty Pharmacist

## 2017-08-01 LAB — HLA DS POST TRANSPLANT
ANTI-DONOR DRW #2 MFI: 10 MFI
ANTI-DONOR HLA-A #1 MFI: 13 MFI
ANTI-DONOR HLA-A #2 MFI: 17 MFI
ANTI-DONOR HLA-B #1 MFI: 0 MFI
ANTI-DONOR HLA-B #2 MFI: 40 MFI
ANTI-DONOR HLA-C #1 MFI: 146 MFI
ANTI-DONOR HLA-C #2 MFI: 0 MFI
ANTI-DONOR HLA-DQB #1 MFI: 8 MFI
ANTI-DONOR HLA-DR #1 MFI: 29 MFI
ANTI-DONOR HLA-DR #2 MFI: 0 MFI
DONOR DRW ANTIGEN #1: 52
DONOR DRW ANTIGEN #2: 53
DONOR HLA-A ANTIGEN #1: 1
DONOR HLA-A ANTIGEN #2: 2
DONOR HLA-B ANTIGEN #1: 8
DONOR HLA-B ANTIGEN #2: 41
DONOR HLA-C ANTIGEN #1: 7
DONOR HLA-C ANTIGEN #2: 17
DONOR HLA-DQB ANTIGEN #1: 2
DONOR HLA-DR ANTIGEN #1: 17
DONOR HLA-DR ANTIGEN #2: 7

## 2017-08-01 LAB — HLA CLASS 1 ANTIBODY RESULT: Lab: POSITIVE

## 2017-08-01 LAB — FSAB CLASS 1 ANTIBODY SPECIFICITY

## 2017-08-01 LAB — HLA CL2 AB COMMENT: Lab: 0

## 2017-08-01 LAB — ANTI-DONOR HLA-B #1 MFI: Lab: 0

## 2017-08-01 MED ORDER — MYCOPHENOLATE MOFETIL 500 MG TABLET
ORAL_TABLET | Freq: Two times a day (BID) | ORAL | 3 refills | 0.00000 days | Status: CP
Start: 2017-08-01 — End: 2017-08-30

## 2017-08-01 MED FILL — TACROLIMUS/0.5MG/CAPS: TACROLIMUS/0.5MG/CAPS | 30 days supply | Qty: 90 | Fill #0

## 2017-08-04 ENCOUNTER — Ambulatory Visit: Admit: 2017-08-04 | Discharge: 2017-08-05 | Payer: MEDICARE

## 2017-08-04 DIAGNOSIS — Z942 Lung transplant status: Principal | ICD-10-CM

## 2017-08-04 LAB — COMPREHENSIVE METABOLIC PANEL
ALBUMIN: 3.9 g/dL (ref 3.5–5.0)
ALKALINE PHOSPHATASE: 49 U/L (ref 38–126)
ALT (SGPT): 13 U/L — ABNORMAL LOW (ref 19–72)
ANION GAP: 11 mmol/L (ref 9–15)
AST (SGOT): 17 U/L — ABNORMAL LOW (ref 19–55)
BLOOD UREA NITROGEN: 46 mg/dL — ABNORMAL HIGH (ref 7–21)
BUN / CREAT RATIO: 13
CALCIUM: 9.1 mg/dL (ref 8.5–10.2)
CHLORIDE: 106 mmol/L (ref 98–107)
CO2: 23 mmol/L (ref 22.0–30.0)
CREATININE: 3.64 mg/dL — ABNORMAL HIGH (ref 0.70–1.30)
EGFR MDRD NON AF AMER: 17 mL/min/{1.73_m2} — ABNORMAL LOW (ref >=60)
GLUCOSE RANDOM: 109 mg/dL (ref 65–179)
POTASSIUM: 3.7 mmol/L (ref 3.5–5.0)
PROTEIN TOTAL: 6.1 g/dL — ABNORMAL LOW (ref 6.5–8.3)
SODIUM: 140 mmol/L (ref 135–145)

## 2017-08-04 LAB — CBC W/ AUTO DIFF
BASOPHILS ABSOLUTE COUNT: 0 10*9/L (ref 0.0–0.1)
BASOPHILS RELATIVE PERCENT: 0.1 %
EOSINOPHILS ABSOLUTE COUNT: 0 10*9/L (ref 0.0–0.4)
EOSINOPHILS RELATIVE PERCENT: 0.3 %
HEMATOCRIT: 34.2 % — ABNORMAL LOW (ref 41.0–53.0)
HEMOGLOBIN: 11.2 g/dL — ABNORMAL LOW (ref 13.5–17.5)
LYMPHOCYTES ABSOLUTE COUNT: 0.5 10*9/L — ABNORMAL LOW (ref 1.5–5.0)
LYMPHOCYTES RELATIVE PERCENT: 7.6 %
MEAN CORPUSCULAR HEMOGLOBIN CONC: 32.8 g/dL (ref 31.0–37.0)
MEAN CORPUSCULAR HEMOGLOBIN: 32.1 pg (ref 26.0–34.0)
MEAN CORPUSCULAR VOLUME: 98.1 fL (ref 80.0–100.0)
MEAN PLATELET VOLUME: 8.5 fL (ref 7.0–10.0)
MONOCYTES ABSOLUTE COUNT: 0.3 10*9/L (ref 0.2–0.8)
MONOCYTES RELATIVE PERCENT: 4.8 %
NEUTROPHILS ABSOLUTE COUNT: 6 10*9/L (ref 2.0–7.5)
NEUTROPHILS RELATIVE PERCENT: 84.8 %
PLATELET COUNT: 204 10*9/L (ref 150–440)
RED CELL DISTRIBUTION WIDTH: 15.3 % — ABNORMAL HIGH (ref 12.0–15.0)
WBC ADJUSTED: 7.1 10*9/L (ref 4.5–11.0)

## 2017-08-04 LAB — MAGNESIUM: Magnesium:MCnc:Pt:Ser/Plas:Qn:: 2.1

## 2017-08-04 LAB — TACROLIMUS BLOOD: Lab: 4.3

## 2017-08-04 LAB — GAMMAGLOBULIN; IGG: IgG:MCnc:Pt:Ser/Plas:Qn:: 613

## 2017-08-04 LAB — ALKALINE PHOSPHATASE: Alkaline phosphatase:CCnc:Pt:Ser/Plas:Qn:: 49

## 2017-08-04 LAB — MONOCYTES RELATIVE PERCENT: Lab: 4.8

## 2017-08-04 LAB — PHOSPHORUS: Phosphate:MCnc:Pt:Ser/Plas:Qn:: 4.1

## 2017-08-04 NOTE — Unmapped (Signed)
Called Angel Nguyen to discuss lab results. Tacrolimus level was Normal at 4.3 with a goal of 4-6.   Medication regimen is currently 1/0.5.  Dosing at this time to remain the same.  Repeat labs scheduled monthly.  Plan to f/u with other labwork as results made available.  All questions answered.  Pt verbalized understanding.

## 2017-08-05 LAB — CMV COMMENT: Lab: 0

## 2017-08-05 LAB — CMV DNA, QUANTITATIVE, PCR: CMV VIRAL LD: NOT DETECTED

## 2017-08-08 LAB — EBV VIRAL LOAD RESULT: Lab: NOT DETECTED

## 2017-08-08 LAB — EBV QUANTITATIVE PCR, BLOOD: EBV VIRAL LOAD RESULT: NOT DETECTED

## 2017-08-17 NOTE — Unmapped (Signed)
Patient confirmed attendance for 08/23/17 kidney orientation

## 2017-08-22 NOTE — Unmapped (Signed)
Bethanie Dicker called regarding Krishawn's overall state of wellness. Angie reports that there are several recent changes, and she is unsure which is the culprit, but that Dell is overall more lethargic.     He has bad hemorrhoids, that are bleeding badly, they are limiting his day to day activities as well as willingness to stay active, she said he was started on Flomax as well as an increase was made to his BP medicine. His BP's are improved and the ankle swelling has resolved, however Kyree just id not feeling well. She said prior to these changes, he had been doing really well.     She has requested to be seen sooner. Appointment request sent.

## 2017-08-23 ENCOUNTER — Ambulatory Visit: Admit: 2017-08-23 | Discharge: 2017-08-23 | Payer: MEDICARE

## 2017-08-23 ENCOUNTER — Institutional Professional Consult (permissible substitution): Admit: 2017-08-23 | Discharge: 2017-08-23 | Payer: MEDICARE

## 2017-08-23 ENCOUNTER — Ambulatory Visit: Admit: 2017-08-23 | Discharge: 2017-08-23 | Payer: MEDICARE | Attending: Nephrology | Primary: Nephrology

## 2017-08-23 ENCOUNTER — Other Ambulatory Visit: Admit: 2017-08-23 | Discharge: 2017-08-23 | Payer: MEDICARE

## 2017-08-23 DIAGNOSIS — Z942 Lung transplant status: Principal | ICD-10-CM

## 2017-08-23 DIAGNOSIS — N186 End stage renal disease: Principal | ICD-10-CM

## 2017-08-23 DIAGNOSIS — N184 Chronic kidney disease, stage 4 (severe): Secondary | ICD-10-CM

## 2017-08-23 DIAGNOSIS — Z01818 Encounter for other preprocedural examination: Secondary | ICD-10-CM

## 2017-08-23 DIAGNOSIS — R93429 Abnormal radiologic findings on diagnostic imaging of unspecified kidney: Secondary | ICD-10-CM

## 2017-08-23 LAB — CBC W/ AUTO DIFF
BASOPHILS ABSOLUTE COUNT: 0 10*9/L (ref 0.0–0.1)
EOSINOPHILS ABSOLUTE COUNT: 0 10*9/L (ref 0.0–0.4)
HEMATOCRIT: 33.4 % — ABNORMAL LOW (ref 41.0–53.0)
HEMOGLOBIN: 10.6 g/dL — ABNORMAL LOW (ref 13.5–17.5)
LARGE UNSTAINED CELLS: 2 % (ref 0–4)
LYMPHOCYTES ABSOLUTE COUNT: 0.6 10*9/L — ABNORMAL LOW (ref 1.5–5.0)
LYMPHOCYTES RELATIVE PERCENT: 8.6 %
MEAN CORPUSCULAR HEMOGLOBIN: 30.9 pg (ref 26.0–34.0)
MEAN CORPUSCULAR VOLUME: 97.6 fL (ref 80.0–100.0)
MEAN PLATELET VOLUME: 8 fL (ref 7.0–10.0)
MONOCYTES ABSOLUTE COUNT: 0.3 10*9/L (ref 0.2–0.8)
MONOCYTES RELATIVE PERCENT: 4.4 %
NEUTROPHILS ABSOLUTE COUNT: 5.6 10*9/L (ref 2.0–7.5)
NEUTROPHILS RELATIVE PERCENT: 84.9 %
PLATELET COUNT: 307 10*9/L (ref 150–440)
RED BLOOD CELL COUNT: 3.42 10*12/L — ABNORMAL LOW (ref 4.50–5.90)
RED CELL DISTRIBUTION WIDTH: 14.7 % (ref 12.0–15.0)
WBC ADJUSTED: 6.6 10*9/L (ref 4.5–11.0)

## 2017-08-23 LAB — GAMMAGLOBULIN; IGG: IgG:MCnc:Pt:Ser/Plas:Qn:: 594 — ABNORMAL LOW

## 2017-08-23 LAB — COMPREHENSIVE METABOLIC PANEL
ALBUMIN: 4.2 g/dL (ref 3.5–5.0)
ALKALINE PHOSPHATASE: 47 U/L (ref 38–126)
ANION GAP: 11 mmol/L (ref 9–15)
AST (SGOT): 17 U/L — ABNORMAL LOW (ref 19–55)
BILIRUBIN TOTAL: 0.4 mg/dL (ref 0.0–1.2)
BLOOD UREA NITROGEN: 43 mg/dL — ABNORMAL HIGH (ref 7–21)
BUN / CREAT RATIO: 13
CALCIUM: 9.1 mg/dL (ref 8.5–10.2)
CHLORIDE: 103 mmol/L (ref 98–107)
CO2: 26 mmol/L (ref 22.0–30.0)
CREATININE: 3.28 mg/dL — ABNORMAL HIGH (ref 0.70–1.30)
EGFR MDRD AF AMER: 23 mL/min/{1.73_m2} — ABNORMAL LOW (ref >=60)
EGFR MDRD NON AF AMER: 19 mL/min/{1.73_m2} — ABNORMAL LOW (ref >=60)
GLUCOSE RANDOM: 118 mg/dL (ref 65–179)
POTASSIUM: 3.5 mmol/L (ref 3.5–5.0)
PROTEIN TOTAL: 6.5 g/dL (ref 6.5–8.3)
SODIUM: 140 mmol/L (ref 135–145)

## 2017-08-23 LAB — PHOSPHORUS: Phosphate:MCnc:Pt:Ser/Plas:Qn:: 3.7

## 2017-08-23 LAB — MAGNESIUM: Magnesium:MCnc:Pt:Ser/Plas:Qn:: 1.6

## 2017-08-23 LAB — MONOCYTES RELATIVE PERCENT: Lab: 4.4

## 2017-08-23 LAB — ALBUMIN: Albumin:MCnc:Pt:Ser/Plas:Qn:: 4.2

## 2017-08-23 NOTE — Unmapped (Signed)
See dictated note.

## 2017-08-23 NOTE — Unmapped (Signed)
Angel Nguyen attended Kidney Transplant Orientation Class today and was instructed to attend the rest of the scheduled appointments. Orientation class was at least 1 hour and 5 minutes long with additional time to answer questions.

## 2017-08-24 LAB — CMV DNA, QUANTITATIVE, PCR: CMV VIRAL LD: NOT DETECTED

## 2017-08-24 LAB — CMV QUANT LOG10: Lab: 0

## 2017-08-24 LAB — TACROLIMUS BLOOD: Lab: 7.7

## 2017-08-24 MED ORDER — TACROLIMUS 0.5 MG CAPSULE: capsule | 3 refills | 0 days | Status: AC

## 2017-08-24 MED ORDER — TACROLIMUS 0.5 MG CAPSULE
ORAL_CAPSULE | ORAL | 3 refills | 0.00000 days | Status: CP
Start: 2017-08-24 — End: 2017-08-30

## 2017-08-24 NOTE — Unmapped (Signed)
Called Angel Nguyen to discuss lab results. Tacrolimus level was Increased  at 7.7 with a goal of 4-6.   Medication regimen is currently 1/0.5.  Dosing at this time to remain the same as patient had already taken meds at time of blood draw.  Repeat labs scheduled monthly.  Plan to f/u with other labwork as results made available.  All questions answered.  Pt verbalized understanding.

## 2017-08-24 NOTE — Unmapped (Signed)
REFERRING PHYSICIAN:  Dr. Reggie Pile.    PULMONOLOGIST:  Dr. Reggie Pile and Dr. Dudley Major.    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  His last creatinine was 3.64 with an eGFR of 17 on 08/04/2017.  This patient is being seen in consultation at the request of Dr. Reggie Pile for evaluation regarding his candidacy for renal transplantation.    Angel Nguyen was diagnosed with interstitial lung disease and idiopathic pulmonary fibrosis in 2013, likely associated to asbestos exposure according to the patient.  He received a double lung transplant on 11/27/2014 at [facility].  He initially had mild acute rejection and received IV steroids but has had  no rejection since that time and has no DSAs. He has stable pulmonary function. He is maintained on Tacrolimus, CellCept and prednisone.  He has had issues with hypertension for the past 2-3 years.  He had past issues with reflux symptoms and had a Nissen procedure in early 2017.  He has had   hypogammaglobulinemia with low IgG levels and received IVIG twice in the past.  More recently, he has had issues with nocturia which is improved since starting on Flomax. He denies any known history of diabetes, coronary artery disease, heart attack, stroke, liver disease, hepatitis, blood clots, skin cancers, or a history of chronic infections.    Currently, he is feeling well. He works part-time. He has current issues with bleeding hemorrhoids with upcoming evaluation scheduled. His previous lower extremity swelling has improved recently and he uses compression stockings usually.  He notes occasional reflux symptoms.  He has a good appetite with no nausea/vomiting.  He denies any recent chest pain, shortness of breath, abdominal pain, fever, chills or itching.    PAST MEDICAL HISTORY:  1.  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  2.  Interstitial lung disease diagnosed in 2013 as idiopathic pulmonary fibrosis, likely associated to asbestos exposure according to the patient.  3.  History of bilateral lung transplant (BOLT) on 12/07/2014 at [facility], followed by [facility] Lung Transplant team, no DSAs, early mild acute rejection treated with IV steroids, no evidence of rejection since then, maintained on Prograf, CellCept and prednisone. Pulmonary function testing has been stable.  4.  History of GERD symptoms with Nissen procedure in early 2017, has occasional reflux symptoms currently.  5.  History of hypogammaglobulinemia with low IgG levels, has received IVIG x2 in the past.  Most recent IgG level was 613, 08/04/2017.  6.  Hypertension for past 2-3 years, since transplant.  7.  History of cataract surgery bilaterally with improvement.  8.  Recent issues with nocturia, evaluation by Urology, started on Flomax with improvement in nocturia symptoms.  9.  Bleeding hemorrhoids, upcoming evaluation planned.    ALLERGIES:  Lisinopril caused a cough.  Losartan caused a cough.  Shrimp causes hives.  NSAIDs are contraindicated due to chronic kidney disease.    MEDICATIONS:  Updated in Epic.    SOCIAL HISTORY:  Angel Nguyen is married and his wife is present at his clinic visit today.  He currently lives with his spouse in [place].  He has 2 biological children, 3 stepchildren, and 3 grandchildren.  He is on disability but works part-time as a IT sales professional.  He does have a past smoking history of 1 pack per day for many years but quit smoking  several years ago.  He admits to drinking an occasional beer about twice weekly and denies any use of illicit drugs during his lifetime.    FAMILY HISTORY:  His mother died secondary to breast cancer and did have kidney disease and was on dialysis.  His father died due to a brain aneurysm.  He has a sister with a history of what he describes as sarcoma and a brother who has issues with drug abuse.  Regarding possible living donors, he does have a stepson who may be interested in being evaluated as a possible donor for him.    SCREENING EVALUATIONS:  1.  Cardiac evaluation.  He had a right heart cath prior to his lung transplant in 2016. No recent cardiac studies.  2.  Colonoscopy was done at [facility] in 2015 with recommended repeat in 10 years.  3.  Screening PSA.  None noted in Care Everywhere or Epic.  4.  Dental evaluation.  He has had no recent dental visits but has no identified dental issues.    REVIEW OF SYSTEMS:  The balance of the 12-point review of systems is negative or as outlined in the history of present illness.    PHYSICAL EXAMINATION:  GENERAL:  Well-appearing well-nourished white male, in no acute distress, very pleasant, interactive.  SKIN:  Without lesions or rashes.  HEENT:  Nonicteric.  PERRL.  Oropharynx without lesions.  Teeth in very good condition.  NECK:  Supple without adenopathy or thyromegaly.  CARDIAC:  Normal S1/S2 without murmurs, rubs or gallops.  LUNGS:  Clear to auscultation.  ABDOMEN:  Soft, nontender, with positive bowel sounds.  EXTREMITIES:  No peripheral edema noted.  NEUROLOGIC:  Alert and oriented with appropriate affect and conversation.    ASSESSMENT AND PLAN:  1.  Overall, we believe that Angel Nguyen will likely be an acceptable candidate for kidney transplantation at [facility] and will proceed with his evaluation as outlined below.  2.  He needs cardiac evaluation for clearance so we will schedule a nuclear medicine stress test at [facility] as part of his evaluation.  If his stress test is positive, he will need to see our transplant cardiologist for further evaluation and clearance.  3.  Due to his age, we will schedule a CT of the abdomen and pelvis to evaluate his iliacs and other blood vessels.  His CT scan is scheduled for this afternoon.  We will ask our transplant surgeons to review his CT images to be sure they are acceptable.  4.  We will request that the [facility] Lung Transplant team provide pulmonary clearance for Angel Nguyen for kidney transplant listing.  5.  Regarding his issue of hypogammaglobulinemia, we will continue to monitor his IgG levels to determine any possible need for future IVIG infusions.  6.  Regarding screening evaluations, he is up to date on colonoscopy.  We will check a screening PSA with his labs.  We will provide dental clearance for him today based on exam.  7.  Regarding possible living donors, he does have a possible living donor. They have information for our donor coordinator to share with him and any other possible donor options.  8.  We did discuss with Angel Nguyen and his wife today that he does not need a kidney transplant now, but we would wish to proceed with his evaluation to get him listed with inactive status to begin accruing waiting time.  We would then activate him when his kidney function declined.  9.  In summary, we believe that Angel Nguyen will likely be an appropriate candidate for kidney transplant listing at [facility] and will proceed with his evaluation as outlined above.  We are hopeful that we will be able to identify a suitable living donor for him to proceed with a living donor kidney transplant when his kidney function declines.    This patient was seen in conjunction with Dr. Leeroy Bock who was in agreement with the stated plan above.

## 2017-08-24 NOTE — Unmapped (Signed)
[facility] Refill Coordination Note    Specialty Medication(s) to be Shipped:   Transplant: tacrolimus 0.5mg      Angel Nguyen, DOB: 19-May-1955  Phone: [phone] (home)   Shipping Address: [address]  [place] [address]    All above HIPAA information was verified with patient.     Completed refill call assessment today to schedule patient's medication shipment from the [facility] [phone]).       Specialty medication(s) and dose(s) confirmed: Regimen is correct and unchanged.   Changes to medications: Angel Nguyen reports no changes reported at this time.  Changes to insurance: No  Questions for the pharmacist: No    The patient will receive an FSI print out for each medication shipped and additional FDA Medication Guides as required.  Patient education from [name] or [name] may also be included in the shipment.    DISEASE-SPECIFIC INFORMATION        N/A    ADHERENCE     Medication Adherence    Patient reported X missed doses in the last month:  0          MEDICARE PART B DOCUMENTATION     Tacrolimus 0.5mg : Patient has 8 days worth of capsules on hand.    SHIPPING     Shipping address confirmed in FSI.     Delivery Scheduled: Yes, Expected medication delivery date: 09/01/2017 via UPS or courier.     Angel Nguyen   [facility] Specialty Technician

## 2017-08-25 ENCOUNTER — Ambulatory Visit: Admit: 2017-08-25 | Discharge: 2017-08-26 | Payer: MEDICARE | Attending: Surgery | Primary: Surgery

## 2017-08-25 DIAGNOSIS — K649 Unspecified hemorrhoids: Principal | ICD-10-CM

## 2017-08-25 LAB — EBV VIRAL LOAD RESULT: Lab: NOT DETECTED

## 2017-08-25 NOTE — Unmapped (Signed)
GENERAL SURGERY OFFICE NOTE      ASSESSMENT/PLAN  44M s/p BOLTx in Sept 2016 with hx of bleeding hemorrhoids. Currently, hemorrhoids have stopped bleeding and are asymptomatic. Recommend increasing fiber intake to at least 25-30 g/day (and staying well hydrated); can take a fiber supplement to help meet this goal. Discussed options of excisional hemorrhoidectomy (to treat external and internal hemorrhoids) vs less invasive procedures to treat internal hemorrhoids only (IRC by GI). Discussed that IRC cannot be used to treat external hemorrhoids. Due to his immunosuppression, he is not a candidate for rubber band ligation of internal hemorrhoids. No need to excise the perianal skin tags. Since he is immunosuppressed and is at higher risk of infection, would recommend excisional hemorrhoidectomy as last option for treatment. Pt agrees with this plan and would like to leave surgery as a last resort. If he has another bleeding episode, would consult GI for consideration of IRC. All of his questions answered. Advised him to call us if his hemorrhoids flare up again.       Letha Cape  August 25, 2017 1:20 PM      REFERRING PROVIDER:Christina White, NP    UJ:[id number] hemorrhoids      HPI  44M s/p BOLTx in Sept 2016 referred for eval of hemorrhoids. Pt states that a few weeks ago, he noticed a little bit of blood with his BMs. Had never had problems with hemorrhoids before. The bleeding got worse over time, however, and got so bad that the blood would soak through his pants. He started using Prep H and another OTC cream called Rectal Care (sp?) 4 days ago. Since using this product, pt states that his bleeding has stopped and his hemorrhoids have resolved. He's never had any rectal pain. Denies precipitating constipation/diarrhea. Had a colonoscopy in [place] right before his lung txp and was told that he had 2-3 polyps and hemorrhoids but that he wouldn't need another colonoscopy for 10 years (although his txp team may want him to have his next one at the 5 year mark due to his immunosuppression). Doesn't think that he has any tissue that stays protruding from his rectum, states that the hemorrhoids go back in after he has a BM. Eats lots of fruits and veggies, so thinks his fiber intake is pretty good.    Pt is on CellCept, prednisone (down to 7.5 mg), and tacrolimus. Pt states that he's being eval'd for a kidney txp as the tacrolimus has damaged his kidneys (although his Cr is improving; was 3.28 on 08/23/17). Bruises easily due to the prednisone.       PMH  Past Medical History:   Diagnosis Date   ??? Anisocoria    ??? Colon polyps     removed   ??? COPD (chronic obstructive pulmonary disease) (CMS-HCC)    ??? GERD (gastroesophageal reflux disease)    ??? Hypertension    ??? Interstitial lung disease (CMS-HCC)    ??? NSIP (nonspecific interstitial pneumonia) (CMS-HCC)          PSH  Past Surgical History:   Procedure Laterality Date   ??? BACK SURGERY  2005    discectomy    ??? BARIUM SWALLOW W CINE [facility] HISTORICAL RESULT)     ??? CARDIAC CATHETERIZATION      patients states normal   ??? CATARACT EXTRACTION Right 02/17/2016    pcIOL    ??? COLONOSCOPY W/ POLYPECTOMY     ??? ESOPHAGOGASTRODUODENOSCOPY     ??? EYE SURGERY Bilateral 2005  lasik   ??? HERNIA REPAIR Right     ingunial    ??? LASIK Bilateral 2005    Dr Nile Riggs   ??? LUNG BIOPSY     ??? LUNG TRANSPLANT, DOUBLE  Sept 2016   ??? PR BRONCHOSCOPY,DIAGNOSTIC W LAVAGE Bilateral 12/24/2014    Procedure: BRONCHOSCOPY, RIGID OR FLEXIBLE, INCLUDE FLUOROSCOPIC GUIDANCE WHEN PERFORMED; W/BRONCHIAL ALVEOLAR LAVAGE With Moderate Sedation;  Surgeon: Anson Fret, MD;  Location: BRONCH PROCEDURE LAB [facility];  Service: Pulmonary   ??? PR BRONCHOSCOPY,DIAGNOSTIC W LAVAGE Bilateral 01/30/2015    Procedure: BRONCHOSCOPY, RIGID OR FLEXIBLE, INCLUDE FLUOROSCOPIC GUIDANCE WHEN PERFORMED; W/BRONCHIAL ALVEOLAR LAVAGE With Moderate Sedation.;  Surgeon: Anson Fret, MD;  Location: BRONCH PROCEDURE LAB [facility];  Service: Pulmonary   ??? PR BRONCHOSCOPY,DIAGNOSTIC W LAVAGE Bilateral 05/13/2015    Procedure: BRONCHOSCOPY, RIGID OR FLEXIBLE, INCLUDE FLUOROSCOPIC GUIDANCE WHEN PERFORMED; W/BRONCHIAL ALVEOLAR LAVAGE With Moderate Sedation;  Surgeon: Jamesetta Geralds, MD;  Location: BRONCH PROCEDURE LAB [facility];  Service: Pulmonary   ??? PR BRONCHOSCOPY,DIAGNOSTIC W LAVAGE Bilateral 09/24/2015    Procedure: BRONCHOSCOPY, FLEXIBLE, INCLUDE FLUOROSCOPIC GUIDANCE WHEN PERFORMED; W/BRONCHIAL ALVEOLAR LAVAGE WITH MODERATE SEDATION;  Surgeon: Jamesetta Geralds, MD;  Location: BRONCH PROCEDURE LAB [facility];  Service: Pulmonary   ??? PR BRONCHOSCOPY,DIAGNOSTIC W LAVAGE Bilateral 04/13/2016    Procedure: BRONCHOSCOPY, RIGID OR FLEXIBLE, INCLUDE FLUOROSCOPIC GUIDANCE WHEN PERFORMED; W/BRONCHIAL ALVEOLAR LAVAGE WITH MODERATE SEDATION WITH MODERATE SEDATION;  Surgeon: Jamesetta Geralds, MD;  Location: BRONCH PROCEDURE LAB [facility];  Service: Pulmonary   ??? PR BRONCHOSCOPY,DIAGNOSTIC W LAVAGE Bilateral 06/04/2016    Procedure: BRONCHOSCOPY, RIGID OR FLEXIBLE, INCLUDE FLUOROSCOPIC GUIDANCE WHEN PERFORMED; W/BRONCHIAL ALVEOLAR LAVAGE WITH MODERATE SEDATION;  Surgeon: Jamesetta Geralds, MD;  Location: BRONCH PROCEDURE LAB [facility];  Service: Pulmonary   ??? PR BRONCHOSCOPY,TRANSBRONCH BIOPSY N/A 12/24/2014    Procedure: BRONCHOSCOPY, RIGID/FLEXIBLE, INCLUDE FLUORO GUIDANCE WHEN PERFORMED; W/TRANSBRONCHIAL LUNG BX, SINGLE LOBE With Moderate Sedation;  Surgeon: Anson Fret, MD;  Location: BRONCH PROCEDURE LAB [facility];  Service: Pulmonary   ??? PR BRONCHOSCOPY,TRANSBRONCH BIOPSY N/A 01/30/2015    Procedure: BRONCHOSCOPY, RIGID/FLEXIBLE, INCLUDE FLUORO GUIDANCE WHEN PERFORMED; W/TRANSBRONCHIAL LUNG BX, SINGLE LOBE With Moderate Sedation.;  Surgeon: Anson Fret, MD;  Location: BRONCH PROCEDURE LAB [facility];  Service: Pulmonary   ??? PR BRONCHOSCOPY,TRANSBRONCH BIOPSY N/A 05/13/2015    Procedure: BRONCHOSCOPY, RIGID/FLEXIBLE, INCLUDE FLUORO GUIDANCE WHEN PERFORMED; W/TRANSBRONCHIAL LUNG BX, SINGLE LOBE With Moderate Sedation;  Surgeon: Jamesetta Geralds, MD;  Location: BRONCH PROCEDURE LAB [facility];  Service: Pulmonary   ??? PR BRONCHOSCOPY,TRANSBRONCH BIOPSY N/A 09/24/2015    Procedure: BRONCHOSCOPY, FLEXIBLE, INCLUDE FLUORO GUIDANCE WHEN PERFORMED; W/TRANSBRONCHIAL LUNG BX, SINGLE LOBE WITH MODERATE SEDATION;  Surgeon: Jamesetta Geralds, MD;  Location: BRONCH PROCEDURE LAB [facility];  Service: Pulmonary   ??? PR BRONCHOSCOPY,TRANSBRONCH BIOPSY N/A 04/13/2016    Procedure: BRONCHOSCOPY, RIGID/FLEXIBLE, INCLUDE FLUORO GUIDANCE WHEN PERFORMED; W/TRANSBRONCHIAL LUNG BX, SINGLE LOBE;  Surgeon: Jamesetta Geralds, MD;  Location: BRONCH PROCEDURE LAB [facility];  Service: Pulmonary   ??? PR BRONCHOSCOPY,TRANSBRONCH BIOPSY N/A 06/04/2016    Procedure: BRONCHOSCOPY, RIGID/FLEXIBLE, INCLUDE FLUORO GUIDANCE WHEN PERFORMED; W/TRANSBRONCHIAL LUNG BX, SINGLE LOBE WITH MODERATE SEDATION;  Surgeon: Jamesetta Geralds, MD;  Location: BRONCH PROCEDURE LAB [facility];  Service: Pulmonary   ??? PR ESOPHAGEAL MOTILITY STUDY, MANOMETRY N/A 02/21/2014    Procedure: ESOPHAGEAL MOTILITY STUDY W/INT & REP;  Surgeon: Nurse-Based Giproc;  Location: GI PROCEDURES MEMORIAL [facility];  Service: Gastroenterology   ??? PR ESOPHAGEAL MOTILITY STUDY, MANOMETRY N/A 06/12/2015    Procedure: ESOPHAGEAL MOTILITY STUDY W/INT &  REP;  Surgeon: Nurse-Based Giproc;  Location: GI PROCEDURES MEMORIAL [facility];  Service: Gastroenterology   ??? PR GERD TST W/ MUCOS IMPEDE ELECTROD,>1HR N/A 02/21/2014    Procedure: ESOPHAGEAL FUNCTION TEST, GASTROESOPHAGEAL REFLUX TEST W/ NASAL CATHETER INTRALUMINAL IMPEDANCE ELECTRODE(S) PLACEMENT, RECORDING, ANALYSIS AND INTERPRETATION; PROLONGED;  Surgeon: Nurse-Based Giproc;  Location: GI PROCEDURES MEMORIAL [facility];  Service: Gastroenterology   ??? PR GERD TST W/ NASAL IMPEDENCE ELECTROD N/A 06/12/2015    Procedure: ESOPH FUNCT TST NASL ELEC PLCMT;  Surgeon: Nurse-Based Giproc;  Location: GI PROCEDURES MEMORIAL [facility];  Service: Gastroenterology   ??? PR GERD TST W/ NASAL PH ELECTROD N/A 06/12/2015    Procedure: ESOPHAGEAL 24 HOUR PH MONITORING;  Surgeon: Nurse-Based Giproc;  Location: GI PROCEDURES MEMORIAL [facility];  Service: Gastroenterology   ??? PR GERD TST W/ NASAL PH ELECTROD N/A 06/19/2015    Procedure: ESOPHAGEAL 24 HOUR PH MONITORING;  Surgeon: Nurse-Based Giproc;  Location: GI PROCEDURES MEMORIAL [facility];  Service: Gastroenterology   ??? PR LAP, REPAIR PARAESOPHAGEAL HERNIA, INCL FUNDOPLASTY W/O MESH Midline 08/28/2015    Procedure: LAPAROSCOPY, SURGICAL, REPAIR PARAESOPHAGEAL HERNIA, INCLUDE FUNDOPLASTY, WHEN PERFORMED; W/O MESH IMPLANT;  Surgeon: Felton Clinton, MD;  Location: MAIN OR [place];  Service: Gastrointestinal   ??? PR LUNG TRANSPLANT,DBL W CP BYPASS Bilateral 11/26/2014    Procedure: LUNG TRANSPL DBL; Ella Jubilee BYPASS;  Surgeon: Evert Kohl, MD;  Location: MAIN OR [facility];  Service: Cardiothoracic   ??? PR REMV CATARACT EXTRACAP,INSERT LENS Right 02/17/2016    Procedure: EXTRACAPSULAR CATARACT REMOVAL W/INSERTION OF INTRAOCULAR LENS PROSTHESIS, MANUAL OR MECHANICAL TECHNIQUE;  Surgeon: Earlie Counts, MD;  [facility]: [facility] OR [facility];  Service: Ophthalmology   ??? PR REMV CATARACT EXTRACAP,INSERT LENS Left 10/19/2016    Procedure: EXTRACAPSULAR CATARACT REMOVAL W/INSERTION OF INTRAOCULAR LENS PROSTHESIS, MANUAL OR MECHANICAL TECHNIQUE;  Surgeon: Earlie Counts, MD;  Location: [facility] OR [facility];  Service: Ophthalmology   ??? PR UPPER GI ENDOSCOPY,DIAGNOSIS N/A 07/30/2015    Procedure: UGI ENDO, INCLUDE ESOPHAGUS, STOMACH, & DUODENUM &/OR JEJUNUM; DX W/WO COLLECTION SPECIMN, BY BRUSH OR WASH;  Surgeon: Zetta Bills, MD;  Location: GI PROCEDURES MEMORIAL [facility];  Service: Gastroenterology   ??? TONSILLECTOMY           MEDS    Current Outpatient Medications:   ???  aspirin (ECOTRIN) 81 MG tablet, Take 81 mg by mouth daily., Disp: , Rfl:   ???  azithromycin (ZITHROMAX) 250 MG tablet, Take 1 tablet (250 mg total) by mouth daily., Disp: 90 tablet, Rfl: 3  ??? cholecalciferol, vitamin D3, (VITAMIN D3) 1,000 unit capsule, Take 1 capsule (1,000 Units total) by mouth Two (2) times a day., Disp: 60 capsule, Rfl: 11  ???  compres.stocking,thigh,reg,med Misc, 2 Product by Miscellaneous route daily., Disp: 2 each, Rfl: 1  ???  furosemide (LASIX) 20 MG tablet, Take 2 tablets (40 mg total) by mouth daily., Disp: 30 tablet, Rfl: 11  ???  hydrALAZINE (APRESOLINE) 25 MG tablet, Take 1 tablet (25 mg total) by mouth Two (2) times a day., Disp: 60 tablet, Rfl: 11  ???  hydroCHLOROthiazide (HYDRODIURIL) 25 MG tablet, Take 1 tablet (25 mg total) by mouth daily., Disp: 90 tablet, Rfl: 3  ???  magnesium oxide (MAGOX) 400 mg tablet, Take 1 tablet (400 mg total) by mouth daily., Disp: 200 tablet, Rfl: 1  ???  metoprolol tartrate (LOPRESSOR) 25 MG tablet, Take 1 tablet (25 mg total) by mouth Two (2) times a day. (Patient taking differently: Take 25 mg by mouth daily. ), Disp:  180 tablet, Rfl: 3  ???  mycophenolate (CELLCEPT) 500 mg tablet, Take 1 tablet (500 mg total) by mouth Two (2) times a day., Disp: 180 tablet, Rfl: 3  ???  omeprazole (PRILOSEC OTC) 20 MG tablet, Take 1 tablet (20 mg total) by mouth nightly., Disp: 90 tablet, Rfl: 3  ???  omeprazole (PRILOSEC) 40 MG capsule, Take 1 capsule (40 mg total) by mouth daily., Disp: 90 capsule, Rfl: 3  ???  pravastatin (PRAVACHOL) 20 MG tablet, Take one tablet with evening  meal, Disp: 90 tablet, Rfl: 3  ???  predniSONE (DELTASONE) 2.5 MG tablet, Take 3 tablets (7.5 mg total) by mouth daily., Disp: 270 tablet, Rfl: 3  ???  sulfamethoxazole-trimethoprim (BACTRIM,SEPTRA) 400-80 mg per tablet, Take 1 tablet (80 mg of trimethoprim total) by mouth daily., Disp: 90 tablet, Rfl: 3  ???  tacrolimus (PROGRAF) 0.5 MG capsule, Take 2 capsules (1mg ) in the AM and 1 capsule (0.5mg ) in the PM., Disp: 270 capsule, Rfl: 3  ???  tamsulosin (FLOMAX) 0.4 mg capsule, Take 1 capsule (0.4 mg total) by mouth nightly., Disp: 30 capsule, Rfl: 5  ???  gabapentin (NEURONTIN) 400 MG capsule, Take 2 capsules (800 mg total) by mouth nightly., Disp: 180 capsule, Rfl: 3      ALLERGIES  Allergies   Allergen Reactions   ??? Nsaids (Non-Steroidal Anti-Inflammatory Drug) Other (See Comments)     CKD    ??? Shrimp Hives   ??? Lisinopril Cough     Other reaction(s): Cough   ??? Losartan Cough     Other reaction(s): Cough   ??? Other      bp meds, pt unsure of names, caused bad cough       SOCIAL HX  Social History     Socioeconomic History   ??? Marital status: Married   Tobacco Use   ??? Smoking status: Former Smoker     Packs/day: 1.00     Years: 30.00     Pack years: 30.00     Last attempt to quit: 01/08/2009     Years since quitting: 8.6   ??? Smokeless tobacco: Never Used   Substance and Sexual Activity   ??? Alcohol use: Yes     Alcohol/week: 2.4 oz     Types: 4 Cans of beer per week     Comment: couple times week   ??? Drug use: No   ??? Sexual activity: Never   Other Topics Concern   ??? Do you use sunscreen? No   ??? Tanning bed use? Yes   ??? Are you easily burned? Yes   ??? Excessive sun exposure? Yes   ??? Blistering sunburns? 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  He is a [place] citizen.  He states he grew up around asbestos and worked with it in [employer].  He smoked 30 years, 1ppd, quit in 2009. He lives with wife, has 2 children.  He drinks 4 alcoholic drinks a week.  He denies illicit drug use.         FAMILY HX  Family History   Problem Relation Age of Onset   ??? Cerebral aneurysm Father    ??? Breast cancer Mother    ??? Kidney failure Mother    ??? No Known Problems Sister    ??? No Known Problems Brother    ??? No Known Problems  Maternal Aunt    ??? No Known Problems Maternal Uncle    ??? No Known Problems Paternal Aunt    ??? No Known Problems Paternal Uncle    ??? No Known Problems Maternal Grandmother    ??? No Known Problems Maternal Grandfather    ??? Glaucoma Paternal Grandmother    ??? Cancer Paternal Grandmother    ??? No Known Problems Paternal Grandfather    ??? Esophageal cancer Neg Hx    ??? Stomach cancer Neg Hx    ??? Colorectal Cancer Neg Hx    ??? Melanoma Neg Hx    ??? Basal cell carcinoma Neg Hx    ??? Squamous cell carcinoma Neg Hx    ??? Amblyopia Neg Hx    ??? Blindness Neg Hx    ??? Cataracts Neg Hx    ??? Diabetes Neg Hx    ??? Hypertension Neg Hx    ??? Macular degeneration Neg Hx    ??? Retinal detachment Neg Hx    ??? Strabismus Neg Hx    ??? Stroke Neg Hx    ??? Thyroid disease Neg Hx        ROS  As per HPI      BP 143/76 (BP Site: L Arm, BP Position: Sitting, BP Cuff Size: Large)  - Pulse 74  - Temp 37.1 ??C (98.7 ??F) (Oral)  - Ht 177.8 cm (5' 10)  - Wt (!) 110.1 kg (242 lb 11.2 oz)  - SpO2 97%  - BMI 34.82 kg/m??   PHYS EXAM  Gen: awake, alert, NAD  HEENT: NCAT, no scleral icterus, pupils equal and round, no oral ulcers, no gross hearing deficitis, dentition normal  Neck: trachea midline, full range of motion  Resp:good resp effort  Rectal: external skin tags noted; small, nonbleeding, nonthrombosed R lateral external hemorrhoid; DRE: normal sphincter tone, no masses, no gross blood  MSK: no malalignment or asymmetry of extremities  Skin: no jaundice or rashes, no induration  Neuro: normal gait and station; cranial nerves, sensory, and motor function all grossly intact  Psych: normal mood and affect, good judgement and insight, oriented x 3    DIAGNOSTIC TESTS  Recent Results (from the past 672 hour(s))   CMV DNA, quantitative, PCR    Collection Time: 08/04/17  8:43 AM   Result Value Ref Range    CMV Viral Ld Not Detected Not Detected    CMV Quant  <0 IU/mL    CMV Quant Log10  <0.00 log IU/mL    CMV Comment     Comprehensive Metabolic Panel    Collection Time: 08/04/17  8:43 AM   Result Value Ref Range    Sodium 140 135 - 145 mmol/L    Potassium 3.7 3.5 - 5.0 mmol/L    Chloride 106 98 - 107 mmol/L    CO2 23.0 22.0 - 30.0 mmol/L    BUN 46 (H) 7 - 21 mg/dL    Creatinine 1.61 (H) 0.70 - 1.30 mg/dL    BUN/Creatinine Ratio 13 EGFR MDRD Non Af Amer 17 (L) >=60 mL/min/1.40m2    EGFR MDRD Af Amer 21 (L) >=60 mL/min/1.45m2    Anion Gap 11 9 - 15 mmol/L    Glucose 109 65 - 179 mg/dL    Calcium 9.1 8.5 - 09.6 mg/dL    Albumin 3.9 3.5 - 5.0 g/dL    Total Protein 6.1 (L) 6.5 - 8.3 g/dL    Total Bilirubin 0.5 0.0 - 1.2 mg/dL    AST 17 (  L) 19 - 55 U/L    ALT 13 (L) 19 - 72 U/L    Alkaline Phosphatase 49 38 - 126 U/L   EBV QUANTITATIVE PCR, BLOOD    Collection Time: 08/04/17  8:43 AM   Result Value Ref Range    EBV Viral Load Result Not Detected Not Detected   IgG    Collection Time: 08/04/17  8:43 AM   Result Value Ref Range    Total IgG 613 600-1,700 mg/dL   Magnesium Level    Collection Time: 08/04/17  8:43 AM   Result Value Ref Range    Magnesium 2.1 1.6 - 2.2 mg/dL   Phosphorus Level    Collection Time: 08/04/17  8:43 AM   Result Value Ref Range    Phosphorus 4.1 2.9 - 4.7 mg/dL   Tacrolimus level    Collection Time: 08/04/17  8:43 AM   Result Value Ref Range    Tacrolimus, Timed 4.3 ng/mL   CBC w/ Differential    Collection Time: 08/04/17  8:43 AM   Result Value Ref Range    WBC 7.1 4.5 - 11.0 10*9/L    RBC 3.49 (L) 4.50 - 5.90 10*12/L    HGB 11.2 (L) 13.5 - 17.5 g/dL    HCT 16.1 (L) 09.6 - 53.0 %    MCV 98.1 80.0 - 100.0 fL    MCH 32.1 26.0 - 34.0 pg    MCHC 32.8 31.0 - 37.0 g/dL    RDW 04.5 (H) 40.9 - 15.0 %    MPV 8.5 7.0 - 10.0 fL    Platelet 204 150 - 440 10*9/L    Neutrophils % 84.8 %    Lymphocytes % 7.6 %    Monocytes % 4.8 %    Eosinophils % 0.3 %    Basophils % 0.1 %    Neutrophil Left Shift 1+ (A) Not Present    Absolute Neutrophils 6.0 2.0 - 7.5 10*9/L    Absolute Lymphocytes 0.5 (L) 1.5 - 5.0 10*9/L    Absolute Monocytes 0.3 0.2 - 0.8 10*9/L    Absolute Eosinophils 0.0 0.0 - 0.4 10*9/L    Absolute Basophils 0.0 0.0 - 0.1 10*9/L    Large Unstained Cells 2 0 - 4 %    Macrocytosis Slight (A) Not Present   Tacrolimus level    Collection Time: 08/23/17 12:10 PM   Result Value Ref Range    Tacrolimus, Timed 7.7 ng/mL   Phosphorus Level Collection Time: 08/23/17 12:10 PM   Result Value Ref Range    Phosphorus 3.7 2.9 - 4.7 mg/dL   Magnesium Level    Collection Time: 08/23/17 12:10 PM   Result Value Ref Range    Magnesium 1.6 1.6 - 2.2 mg/dL   IgG    Collection Time: 08/23/17 12:10 PM   Result Value Ref Range    Total IgG 594 (L) 600-1,700 mg/dL   Comprehensive Metabolic Panel    Collection Time: 08/23/17 12:10 PM   Result Value Ref Range    Sodium 140 135 - 145 mmol/L    Potassium 3.5 3.5 - 5.0 mmol/L    Chloride 103 98 - 107 mmol/L    CO2 26.0 22.0 - 30.0 mmol/L    BUN 43 (H) 7 - 21 mg/dL    Creatinine 8.11 (H) 0.70 - 1.30 mg/dL    BUN/Creatinine Ratio 13     EGFR MDRD Non Af Amer 19 (L) >=60 mL/min/1.36m2    EGFR MDRD Af Amer 23 (L) >=60  mL/min/1.22m2    Anion Gap 11 9 - 15 mmol/L    Glucose 118 65 - 179 mg/dL    Calcium 9.1 8.5 - 24.4 mg/dL    Albumin 4.2 3.5 - 5.0 g/dL    Total Protein 6.5 6.5 - 8.3 g/dL    Total Bilirubin 0.4 0.0 - 1.2 mg/dL    AST 17 (L) 19 - 55 U/L    ALT 14 (L) 19 - 72 U/L    Alkaline Phosphatase 47 38 - 126 U/L   CMV DNA, quantitative, PCR    Collection Time: 08/23/17 12:10 PM   Result Value Ref Range    CMV Viral Ld Not Detected Not Detected    CMV Quant  <0 IU/mL    CMV Quant Log10  <0.00 log IU/mL    CMV Comment     CBC w/ Differential    Collection Time: 08/23/17 12:10 PM   Result Value Ref Range    WBC 6.6 4.5 - 11.0 10*9/L    RBC 3.42 (L) 4.50 - 5.90 10*12/L    HGB 10.6 (L) 13.5 - 17.5 g/dL    HCT 01.0 (L) 27.2 - 53.0 %    MCV 97.6 80.0 - 100.0 fL    MCH 30.9 26.0 - 34.0 pg    MCHC 31.7 31.0 - 37.0 g/dL    RDW 53.6 64.4 - 03.4 %    MPV 8.0 7.0 - 10.0 fL    Platelet 307 150 - 440 10*9/L    Neutrophils % 84.9 %    Lymphocytes % 8.6 %    Monocytes % 4.4 %    Eosinophils % 0.2 %    Basophils % 0.2 %    Neutrophil Left Shift 1+ (A) Not Present    Absolute Neutrophils 5.6 2.0 - 7.5 10*9/L    Absolute Lymphocytes 0.6 (L) 1.5 - 5.0 10*9/L    Absolute Monocytes 0.3 0.2 - 0.8 10*9/L    Absolute Eosinophils 0.0 0.0 - 0.4 10*9/L    Absolute Basophils 0.0 0.0 - 0.1 10*9/L    Large Unstained Cells 2 0 - 4 %    Macrocytosis Slight (A) Not Present    Hypochromasia Slight (A) Not Present   ECG 12 Lead    Collection Time: 08/23/17  2:33 PM   Result Value Ref Range    EKG Systolic BP  mmHg    EKG Diastolic BP  mmHg    EKG Ventricular Rate 74 BPM    EKG Atrial Rate 74 BPM    EKG P-R Interval 174 ms    EKG QRS Duration 98 ms    EKG Q-T Interval 434 ms    EKG QTC Calculation 481 ms    EKG Calculated P Axis 34 degrees    EKG Calculated R Axis -31 degrees    EKG Calculated T Axis -2 degrees    QTC Fredericia 465 ms       Ct Abdomen Pelvis Wo Contrast    Result Date: 08/23/2017  EXAM: CT abdomen and pelvis without contrast DATE: 08/23/2017 2:19 PM ACCESSION: [id number] UN DICTATED: 08/23/2017 2:34 PM INTERPRETATION LOCATION: [facility] CLINICAL INDICATION: 62 years old Male with Pre Kidney transplant evaluation- pre renal transplant [id number]-Pre-transplant evaluation for end stage renal disease  COMPARISON: Renal ultrasound dated 11/19/2016 and earlier studies. TECHNIQUE: A spiral CT scan was obtained without IV contrast from the lung bases to the pubic symphysis.  Images were reconstructed in the axial plane. Coronal and sagittal reformatted images were also provided for  further evaluation. FINDINGS: Evaluation of the solid organs and vasculature is limited in the absence of intravenous contrast. LOWER CHEST: Partially-visualized median sternotomy wires. Fluid distended distal esophagus. ABDOMEN/PELVIS: HEPATOBILIARY: Numerous scattered subcentimeter hypoattenuating foci throughout the liver, too small to characterize. No biliary ductal dilatation. Gallbladder is unremarkable. PANCREAS: Unremarkable. SPLEEN: Unremarkable. ADRENAL GLANDS: Unremarkable. KIDNEYS/URETERS: Atrophic kidneys with minimal perinephric stranding, consistent with medical renal disease. Punctate nonobstructing calculi are noted bilaterally. No evidence of hydronephrosis. BLADDER: Decompressed, but otherwise unremarkable. BOWEL/PERITONEUM/RETROPERITONEUM: Sequela of prior fundoplication. No evidence of bowel obstruction. The appendix is unremarkable. Colonic diverticulosis, without evidence of diverticulitis. No acute inflammatory process. No free fluid or air within the abdomen. VASCULATURE: Abdominal aortic and branch vessel calcifications. There is mild calcification of the right common iliac and minimal calcification of the right external iliac artery. Moderate calcification of the left common iliac artery and mild calcification of the left external iliac artery. Moderate calcification of the bilateral internal iliac arteries. Unremarkable appearance of IVC. LYMPH NODES: No lymphadenopathy. REPRODUCTIVE ORGANS: Unremarkable appearance of the prostate and seminal vesicles. BONES/SOFT TISSUES: Mild multilevel degenerative changes of the spine. No suspicious lytic or blastic osseous lesions. Fat-containing left inguinal hernia. Small fat-containing inguinal hernia on the left.     - Bilaterally atrophic kidneys, consistent with medical renal disease. - Minimal calcification of the right external iliac and mild calcification of the left external iliac arteries, as described above. - Other chronic and incidental findings as noted above.

## 2017-08-25 NOTE — Unmapped (Signed)
Patient here today for hemorrhoid assessment.  States began 2-3 months ago, initially just pain after BM and feeling swelling.  States has had bleeding at times after bowel movements, even through clothing.  This is no longer as bad per patient now that he is using med rectal care.

## 2017-08-26 NOTE — Unmapped (Signed)
-----   Message from Bufford Lope, MD sent at 08/25/2017 10:32 AM EDT -----  Regarding: RE: CT for target review.   Vessels look good to go. Repeat in 3 years.    ----- Message -----  From: Durenda Age, RN  Sent: 08/24/2017   3:00 PM  To: Bufford Lope, MD  Subject: CT for target review.                            Thanks!  This patient is post lung, pre kidney and pre-dialysis.      ----- Message -----  From: Interface, Rad Results In  Sent: 08/23/2017   3:16 PM  To: Durenda Age, RN

## 2017-08-26 NOTE — Unmapped (Signed)
I met with Angel Nguyen regarding his current transplant insurance plan benefits. I discussed the Financial Agreement for Transplant including, current insurance benefits, prescription drug benefits, estimates of possible post-transplant medications and potential out-of-pocket expenses.    I recommend that Angel Nguyen:  Consider fundraising to help with transplant expenses    Angel Nguyen signed the Financial Agreement for Transplant acknowledging that  he read and understands the agreement.    Patient has [employer].

## 2017-08-30 ENCOUNTER — Ambulatory Visit: Admit: 2017-08-30 | Discharge: 2017-08-31 | Payer: MEDICARE

## 2017-08-30 ENCOUNTER — Ambulatory Visit: Admit: 2017-08-30 | Discharge: 2017-08-31 | Payer: MEDICARE | Attending: Family | Primary: Family

## 2017-08-30 DIAGNOSIS — Z942 Lung transplant status: Principal | ICD-10-CM

## 2017-08-30 DIAGNOSIS — R52 Pain, unspecified: Secondary | ICD-10-CM

## 2017-08-30 DIAGNOSIS — D508 Other iron deficiency anemias: Secondary | ICD-10-CM

## 2017-08-30 LAB — GAMMAGLOBULIN; IGG: IgG:MCnc:Pt:Ser/Plas:Qn:: 572 — ABNORMAL LOW

## 2017-08-30 LAB — COMPREHENSIVE METABOLIC PANEL
ALBUMIN: 3.9 g/dL (ref 3.5–5.0)
ALKALINE PHOSPHATASE: 37 U/L — ABNORMAL LOW (ref 38–126)
ALT (SGPT): 19 U/L (ref 19–72)
ANION GAP: 14 mmol/L (ref 9–15)
AST (SGOT): 16 U/L — ABNORMAL LOW (ref 19–55)
BILIRUBIN TOTAL: 0.5 mg/dL (ref 0.0–1.2)
BLOOD UREA NITROGEN: 45 mg/dL — ABNORMAL HIGH (ref 7–21)
BUN / CREAT RATIO: 13
CALCIUM: 9 mg/dL (ref 8.5–10.2)
CHLORIDE: 103 mmol/L (ref 98–107)
CO2: 24 mmol/L (ref 22.0–30.0)
CREATININE: 3.43 mg/dL — ABNORMAL HIGH (ref 0.70–1.30)
EGFR MDRD AF AMER: 22 mL/min/{1.73_m2} — ABNORMAL LOW (ref >=60)
EGFR MDRD NON AF AMER: 18 mL/min/{1.73_m2} — ABNORMAL LOW (ref >=60)
GLUCOSE RANDOM: 91 mg/dL (ref 65–179)
POTASSIUM: 3.5 mmol/L (ref 3.5–5.0)
PROTEIN TOTAL: 6.3 g/dL — ABNORMAL LOW (ref 6.5–8.3)

## 2017-08-30 LAB — FREE T4: Thyroxine.free:MCnc:Pt:Ser/Plas:Qn:: 1.03

## 2017-08-30 LAB — TACROLIMUS, TROUGH: Lab: 2.4 — ABNORMAL LOW

## 2017-08-30 LAB — CBC W/ AUTO DIFF
BASOPHILS RELATIVE PERCENT: 0.3 %
EOSINOPHILS ABSOLUTE COUNT: 0.1 10*9/L (ref 0.0–0.4)
EOSINOPHILS RELATIVE PERCENT: 0.7 %
HEMATOCRIT: 30.9 % — ABNORMAL LOW (ref 41.0–53.0)
HEMOGLOBIN: 9.8 g/dL — ABNORMAL LOW (ref 13.5–17.5)
LARGE UNSTAINED CELLS: 3 % (ref 0–4)
LYMPHOCYTES ABSOLUTE COUNT: 1 10*9/L — ABNORMAL LOW (ref 1.5–5.0)
LYMPHOCYTES RELATIVE PERCENT: 12.7 %
MEAN CORPUSCULAR HEMOGLOBIN CONC: 31.8 g/dL (ref 31.0–37.0)
MEAN CORPUSCULAR HEMOGLOBIN: 31.4 pg (ref 26.0–34.0)
MEAN CORPUSCULAR VOLUME: 98.7 fL (ref 80.0–100.0)
MEAN PLATELET VOLUME: 8.2 fL (ref 7.0–10.0)
MONOCYTES ABSOLUTE COUNT: 0.4 10*9/L (ref 0.2–0.8)
MONOCYTES RELATIVE PERCENT: 5 %
NEUTROPHILS ABSOLUTE COUNT: 6.1 10*9/L (ref 2.0–7.5)
NEUTROPHILS RELATIVE PERCENT: 78.7 %
PLATELET COUNT: 270 10*9/L (ref 150–440)
RED BLOOD CELL COUNT: 3.13 10*12/L — ABNORMAL LOW (ref 4.50–5.90)
RED CELL DISTRIBUTION WIDTH: 14.7 % (ref 12.0–15.0)
WBC ADJUSTED: 7.8 10*9/L (ref 4.5–11.0)

## 2017-08-30 LAB — PHOSPHORUS: Phosphate:MCnc:Pt:Ser/Plas:Qn:: 4.3

## 2017-08-30 LAB — MAGNESIUM: Magnesium:MCnc:Pt:Ser/Plas:Qn:: 1.7

## 2017-08-30 LAB — EGFR MDRD NON AF AMER
Glomerular filtration rate/1.73 sq M.predicted.non black:ArVRat:Pt:Ser/Plas/Bld:Qn:Creatinine-based formula (MDRD): 18 — ABNORMAL LOW

## 2017-08-30 LAB — FERRITIN: Ferritin:MCnc:Pt:Ser/Plas:Qn:: 44.3

## 2017-08-30 LAB — MONOCYTES RELATIVE PERCENT: Lab: 5

## 2017-08-30 LAB — IRON SATURATION (CALC): Iron saturation:MFr:Pt:Ser/Plas:Qn:: 32

## 2017-08-30 LAB — THYROID STIMULATING HORMONE: Thyrotropin:ACnc:Pt:Ser/Plas:Qn:: 3.357 — ABNORMAL HIGH

## 2017-08-30 LAB — IRON & TIBC: IRON SATURATION (CALC): 32 % (ref 20–50)

## 2017-08-30 MED ORDER — HYDRALAZINE 25 MG TABLET
ORAL_TABLET | Freq: Two times a day (BID) | ORAL | 11 refills | 0 days | Status: CP
Start: 2017-08-30 — End: 2018-08-07

## 2017-08-30 MED ORDER — PRAVASTATIN 40 MG TABLET: 40 mg | tablet | Freq: Every day | 3 refills | 0 days | Status: AC

## 2017-08-30 MED ORDER — MYCOPHENOLATE MOFETIL 500 MG TABLET: 1000 mg | tablet | Freq: Two times a day (BID) | 3 refills | 0 days | Status: AC

## 2017-08-30 MED ORDER — PREDNISONE 5 MG TABLET: 5 mg | tablet | Freq: Every day | 3 refills | 0 days | Status: AC

## 2017-08-30 MED ORDER — PREDNISONE 5 MG TABLET
ORAL_TABLET | Freq: Every day | ORAL | 3 refills | 0.00000 days | Status: CP
Start: 2017-08-30 — End: 2017-09-02

## 2017-08-30 MED ORDER — FUROSEMIDE 20 MG TABLET
ORAL_TABLET | Freq: Every day | ORAL | 11 refills | 0 days | Status: CP
Start: 2017-08-30 — End: 2018-08-07

## 2017-08-30 MED ORDER — MAGNESIUM OXIDE 400 MG (241.3 MG MAGNESIUM) TABLET
ORAL_TABLET | Freq: Two times a day (BID) | ORAL | 11 refills | 0.00000 days
Start: 2017-08-30 — End: 2017-11-29

## 2017-08-30 MED ORDER — PRAVASTATIN 40 MG TABLET
ORAL_TABLET | Freq: Every day | ORAL | 3 refills | 0.00000 days | Status: CP
Start: 2017-08-30 — End: 2017-10-31

## 2017-08-30 MED ORDER — FUROSEMIDE 40 MG TABLET: 40 mg | tablet | Freq: Every day | 11 refills | 0 days | Status: AC

## 2017-08-30 MED ORDER — GABAPENTIN 400 MG CAPSULE
ORAL_CAPSULE | Freq: Every evening | ORAL | 3 refills | 0 days | Status: CP
Start: 2017-08-30 — End: 2018-02-28

## 2017-08-30 MED ORDER — CHOLECALCIFEROL (VITAMIN D3) 50 MCG (2,000 UNIT) CAPSULE
ORAL_CAPSULE | Freq: Every day | ORAL | 11 refills | 0.00000 days | Status: CP
Start: 2017-08-30 — End: 2017-08-30

## 2017-08-30 MED ORDER — TACROLIMUS 0.5 MG CAPSULE: each | 11 refills | 0 days

## 2017-08-30 MED ORDER — FUROSEMIDE 40 MG TABLET
ORAL_TABLET | Freq: Every day | ORAL | 11 refills | 0.00000 days | Status: CP
Start: 2017-08-30 — End: 2017-08-30

## 2017-08-30 MED ORDER — MYCOPHENOLATE MOFETIL 500 MG TABLET: tablet | 11 refills | 0 days

## 2017-08-30 MED ORDER — TACROLIMUS 0.5 MG CAPSULE
Freq: Two times a day (BID) | ORAL | 11 refills | 0.00000 days | Status: CP
Start: 2017-08-30 — End: 2017-08-30

## 2017-08-30 MED ORDER — MYCOPHENOLATE MOFETIL 500 MG TABLET
ORAL_TABLET | Freq: Two times a day (BID) | ORAL | 3 refills | 0.00000 days | Status: CP
Start: 2017-08-30 — End: 2017-09-02

## 2017-08-30 MED ORDER — CHOLECALCIFEROL (VITAMIN D3) 50 MCG (2,000 UNIT) CAPSULE: capsule | 11 refills | 0 days | Status: AC

## 2017-08-30 MED ORDER — OMEPRAZOLE 40 MG CAPSULE,DELAYED RELEASE: 40 mg | capsule | Freq: Every day | 3 refills | 0 days | Status: AC

## 2017-08-30 MED ORDER — OMEPRAZOLE 40 MG CAPSULE,DELAYED RELEASE
ORAL_CAPSULE | Freq: Every day | ORAL | 3 refills | 0.00000 days | Status: CP
Start: 2017-08-30 — End: 2017-10-31

## 2017-08-30 NOTE — Unmapped (Signed)
Follow up as instructed

## 2017-08-30 NOTE — Unmapped (Signed)
[facility] Specialty Medication Referral: No PA required    Medication (Brand/Generic): MYCOPHENOLATE 500MG     Initial FSI Test Claim completed with resulted information below:  No PA required  Patient ABLE to fill at [facility]  Insurance Company:  ALL  Anticipated Copay: $39.31    As Co-pay is under $100 defined limit, per policy there will be no further investigation of need for financial assistance at this time unless patient requests. This referral has been communicated to the provider and handed off to the [facility] team for further processing and filling of prescribed medication.   ______________________________________________________________________  Please utilize this referral for viewing purposes as it will serve as the central location for all relevant documentation and updates.

## 2017-08-30 NOTE — Unmapped (Signed)
[facility] TRANSPL[facility] PHARMACY NOTE  08/30/2017   Angel Nguyen  [id number]    Medication changes today:   1. Decreased prednisone from 7.5 to 5 mg daily  2. Increased pravastatin from 20 to 40 mg nightly  3. Increased magnesium from 400 mg daily to BID  4. Decreased hydralazine from 25 to 12.5 mg BID    Education/Adherence tools provided today:  1.provided updated medication list   2. Provided additional education on medications and associated side effects    Follow up items:  1. BP and HR readings with reduced hydralazine dose  2. Continue monitoring magnesium levels    3. Monitor for signs of HPA axis suppression w/ reduced prednisone dose    Next visit with pharmacy 1 month  ____________________________________________________________________    Angel Nguyen is a 62 y.o. 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     Other PMH significant for GERD, HTN    Seen by pharmacy today for: pill box assessment and adherence education and medication management; last seen by pharmacy 12 months ago    CC:  Patient complains of increased tiredness and morning dizziness since last transplant visit 07/26/17. At that visit, the patient was started on hydralazine 25 mg BID for elevated BP (including one isolated reading with SBP > 200). Patient was also recently started on tamsulosin for nocturia (which the patient noted has helped). Patient's chronic hemorrhoids have improved over the past week, but prior were causing signifcant blood loss. Of note, patient is being evaluated for kidney transplant, last seen 08/23/17 for this.    Vitals:    08/30/17 0932   BP: 117/74   Pulse: 67   Temp: 36.4 ??C (97.5 ??F)     Allergies   Allergen Reactions   ??? Nsaids (Non-Steroidal Anti-Inflammatory Drug) Other (See Comments)     CKD    ??? Shrimp Hives   ??? Lisinopril Cough     Other reaction(s): Cough   ??? Losartan Cough     Other reaction(s): Cough   ??? Other      bp meds, pt unsure of names, caused bad cough     All medications reviewed and updated. Medication list includes revisions made during today???s encounter    Outpatient Encounter Medications as of 08/30/2017   Medication Sig Dispense Refill   ??? aspirin (ECOTRIN) 81 MG tablet Take 81 mg by mouth daily.     ??? azithromycin (ZITHROMAX) 250 MG tablet Take 1 tablet (250 mg total) by mouth daily. 90 tablet 3   ??? cholecalciferol, vitamin D3, (VITAMIN D3) 2,000 unit cap Take 1 capsule (2,000 Units total) by mouth daily at 0600. 30 capsule 11   ??? furosemide (LASIX) 20 MG tablet Take 1 tablet (20 mg total) by mouth daily. 30 tablet 11   ??? gabapentin (NEURONTIN) 400 MG capsule Take 2 capsules (800 mg total) by mouth nightly. 180 capsule 3   ??? hydrALAZINE (APRESOLINE) 25 MG tablet Take 0.5 tablets (12.5 mg total) by mouth Two (2) times a day. 30 tablet 11   ??? hydroCHLOROthiazide (HYDRODIURIL) 25 MG tablet Take 1 tablet (25 mg total) by mouth daily. 90 tablet 3   ??? magnesium oxide (MAGOX) 400 mg (241.3 mg magnesium) tablet Take 1 tablet (400 mg total) by mouth Two (2) times a day. 60 tablet 11   ??? metoprolol tartrate (LOPRESSOR) 25 MG tablet Take 1 tablet (25 mg total) by mouth Two (2) times a day. 180 tablet 3   ??? mycophenolate (  CELLCEPT) 500 mg tablet Take 2 tablets (1,000 mg total) by mouth Two (2) times a day. 360 tablet 3   ??? omeprazole (PRILOSEC) 40 MG capsule Take 1 capsule (40 mg total) by mouth daily. 90 capsule 3   ??? pravastatin (PRAVACHOL) 40 MG tablet Take 1 tablet (40 mg total) by mouth daily. Take one tablet with evening  meal 90 tablet 3   ??? predniSONE (DELTASONE) 5 MG tablet Take 1 tablet (5 mg total) by mouth daily. 90 tablet 3   ??? tacrolimus (PROGRAF) 0.5 MG capsule Take 2 capsules (1mg ) in the AM and 1 capsule (0.5mg ) in the PM. 270 capsule 3   ??? tamsulosin (FLOMAX) 0.4 mg capsule Take 1 capsule (0.4 mg total) by mouth nightly. 30 capsule 5   ??? [DISCONTINUED] mycophenolate (CELLCEPT) 500 mg tablet Take 2 tablets (1,000 mg total) by mouth Two (2) times a day. 360 tablet 3   ??? [DISCONTINUED] cholecalciferol, vitamin D3, (VITAMIN D3) 1,000 unit capsule Take 1 capsule (1,000 Units total) by mouth Two (2) times a day. 60 capsule 11   ??? [DISCONTINUED] compres.stocking,thigh,reg,med Misc 2 Product by Miscellaneous route daily. 2 each 1   ??? [DISCONTINUED] furosemide (LASIX) 20 MG tablet Take 2 tablets (40 mg total) by mouth daily. 30 tablet 11   ??? [DISCONTINUED] furosemide (LASIX) 40 MG tablet Take 1 tablet (40 mg total) by mouth daily. 30 tablet 11   ??? [DISCONTINUED] gabapentin (NEURONTIN) 400 MG capsule Take 2 capsules (800 mg total) by mouth nightly. 180 capsule 3   ??? [DISCONTINUED] hydrALAZINE (APRESOLINE) 25 MG tablet Take 1 tablet (25 mg total) by mouth Two (2) times a day. 60 tablet 11   ??? [DISCONTINUED] magnesium oxide (MAGOX) 400 mg tablet Take 1 tablet (400 mg total) by mouth daily. 200 tablet 1   ??? [DISCONTINUED] mycophenolate (CELLCEPT) 500 mg tablet Take 1 tablet (500 mg total) by mouth Two (2) times a day. 180 tablet 3   ??? [DISCONTINUED] omeprazole (PRILOSEC OTC) 20 MG tablet Take 1 tablet (20 mg total) by mouth nightly. 90 tablet 3   ??? [DISCONTINUED] omeprazole (PRILOSEC) 40 MG capsule Take 1 capsule (40 mg total) by mouth daily. 90 capsule 3   ??? [DISCONTINUED] pravastatin (PRAVACHOL) 20 MG tablet Take one tablet with evening  meal 90 tablet 3   ??? [DISCONTINUED] predniSONE (DELTASONE) 2.5 MG tablet Take 3 tablets (7.5 mg total) by mouth daily. 270 tablet 3   ??? [DISCONTINUED] sulfamethoxazole-trimethoprim (BACTRIM,SEPTRA) 400-80 mg per tablet Take 1 tablet (80 mg of trimethoprim total) by mouth daily. 90 tablet 3     No facility-administered encounter medications on file as of 08/30/2017.      Induction agent : basiliximab    CURRENT IMMUNOSUPPRESSION: tacrolimus 1 mg qAM, 0.5 mg qPM prograf/cyclosporine goal: 4-6 (reduced from 6-8 on 07/26/17),  prednisone 7.5 mg (reduced from 10 mg 07/26/17), cellcept 1,000 mg BID    Patient is tolerating immunosuppression well. Of note, patient mistakenly increased cellcept dose from 500 mg BID to 1,000 mg BID a month ago.     IMMUNOSUPPRESSION DRUG LEVELS:  Lab Results   Component Value Date    TACROLIMUS 2.4 (L) 08/30/2017    TACROLIMUS 7.7 08/23/2017    TACROLIMUS 4.3 08/04/2017    TACROLIMUS 6.7 07/26/2017    TACROLIMUS 7.2 04/28/2017    TACROLIMUS 6.9 03/24/2017     Prograf level is accurate 12 hour trough    Graft function assessment via PFTs: stable   FEV1: 2.93; %  predicted: 82.1%  FVC: 3.69; % predicted: 77.6%    DSA: anti HLAs (< 1,000), no DSAs  Biopsies to date: A0B0 on 06/04/16  WBC/ANC:  wnl    Plan: CBC tolerating increased dose of cellcept, so will keep cellcept at the increased dose of 1,000 mg BID to allow for a decrease in prednisone dose (which contributes to the patient's HTN and weight)     OI prophylaxis:   CMV Status: D+/ R-, high risk. CMV prophylaxis with Valcyte has been stopped (has had no positive viral loads)  PCP: Prophylaxis with Bactrim SS has been stopped - did not tolerate long-term ppx: hyperkalemia, leukopenia; received pentamidine inhaled monthly but was stopped at 1 year mark   Aspergillus: stopped as >3 months out    Patient is  not currently receiving any OI prophylaxis    Plan: No change - continue intermittently monitoring CMV viral loads     Infectious History:  Patient does have a history of pathogenic infectious complications following lung transplant with candidal esophagitis and completed oral fluconazole and nystatin swish and swallow.  Plan: continue to monitor    BOS prophylaxis: azithromycin 250 mg daily and omeprazole 40mg  daily  Patient is s/p Nissen  Plan: continue to monitor    CV Prophylaxis:   Aspirin: asa 81 mg   The 10-year ASCVD risk score Denman George DC Jr., et al., 2013) is: 12.1%  Statin: pravastatin; currently 20 mg daily (low-intensity statin)  Plan: Increase pravastatin to 40 mg daily (moderate-intensity statin) given ASCVD risk > 7.5%    BP: Goal < 140/90. Encounter vitals reported above  Home BP ranges: Morning 119-176/70-102; Afternoon 135-163/66-98;  HR in the 60s  Current meds include: hydrochlorothiazide 25 mg daily, hydalazine 25 mg BID, metoprolol 25 mg BID, and furosemide 20 mg daily (patient was supposed to switch to 40 mg daily but continued only taking one tablet daily, thinking they were 40 mg tablets)  Assessment: BP improved with the addition of hydralazine, though still out of goal on average. Patient reporting decreased energy and dizziness in the morning, likely due to the addition of hydralazine, with starting tamsulosin for nocturia possibly contributing. Since his BP was so high prior to starting hydralazine, it's possible that his dizziness is secondary to the acute drop in his BP compared to previous pressures, despite still being above goal. Reasonable to titrate BP meds slower to allow for his body to equilibrate to the lower pressures more gently to avoid adverse effects.   Plan: Reduce hydralazine dose in half (12.5 mg BID) and continue to monitor, with the goal to titrate to BP goal at future visits. Will leave furosemide at 20 mg daily, as patient denies swelling/signs of fluid overload (compression stockings also helping w/ this).    Anemia:  H/H:   Lab Results   Component Value Date    HGB 9.8 (L) 08/30/2017     Lab Results   Component Value Date    HCT 30.9 (L) 08/30/2017     Iron panel:  Lab Results   Component Value Date    IRON 104 08/30/2017    TIBC 323.8 08/30/2017    FERRITIN 44.3 08/30/2017     Lab Results   Component Value Date    LABIRON 32 08/30/2017     Prior ESA use: none    Assessment/Plan: Iron panel pending at time of visit, hemoglobin slightly decreased (likely secondary to hemorrhoidal bleeding). B12 and folate normal at last check (06/30/17). Will add/adjust medications pending  results. Continue to monitor.     DM:   Lab Results   Component Value Date    A1C 5.4 02/28/2015   . Goal A1c < 7  Currently on: no therapy, not monitoring at home  Diet: normal Exercise: very active (though less so recently given reduced energy)  Hypoglycemia: no  Plan:  continue to monitor    Electrolytes: wnl (though magnesium is borderline at 1.7)  Meds currently on: magnesium oxide 400 mg daily  Plan: Increase magnesium to 400 mg BID    BM: regular  Meds currently on: none  Plan: continue    Pain: slight numbness, aches in left forefinger and thumb  Meds currently on: gabapentin 400 mg nightly - tolerating well   Plan: continue    Bone health:   Patient currently on steroid therapy  Vitamin D Level: last level is 36.7 (10/28/16). Goal > 30.   Last DEXA results: none  Current meds include: Vit D 2,000 units BID  Plan: Continue. Continue to monitor.     Women's/Men's Health:  Angel Nguyen is a 62 y.o. male. Patient reports no men's/women's health issues  Plan: continue to monitor    Adherence: Patient has average understanding of medications.  Patient  does not fill their own pill box on a regular basis at home. Wife manages medications.  Patient brought medication card:no  Pill box:did not bring  Patient requested refills for the following meds: None  Corrections needed in Epic medication list: mycophenolate; med changes addressed above  Plan: Continue using phone alarm to remember medications; provided extensive adherence counseling/intervention    Spent approximately 40 minutes on educating this patient and greater than 50% was spent in direct face to face counseling regarding post transplant medication education. Questions and concerns were address to patient's satisfaction.    Patient was reviewed with Dr. Dudley Major who was agreement with the stated plan:     During this visit, the following was completed:   BP log data assessment  Labs ordered and evaluated  complex treatment plan >1 DS   Patient education was completed for 25-60 minutes     All questions/concerns were addressed to the patient's satisfaction.    Elesa Hacker, PharmD  PGY-1 Pharmacy Resident __________________________________________  PATIENT SEEN AND EVALUATED BY:  Abelino Derrick, PHARMD, BCPS, CPP  [employer] TRANSPLANT PHARMACIST PRACTITIONER  PAGER [phone]

## 2017-08-30 NOTE — Unmapped (Signed)
Pulmonary Transplant Clinic    Chief Complaint: Lung Transplant Complications  ????  HISTORY:??   HPI:    62 y.o.  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  - BPs much better at home but,  - Has felt horribly on new BP meds- fatigue, no energy to do anything.  Per Angie just sitting on the couch and not active.  - Has generally felt more cold recently.  - Denies cough, SOB, fevers, chills.  - Denies NVD  - Undergoing workup for kidney transplant, went to first class.  Has extensive testing upcoming.  - Had recent issues with bleeding hemorrhoids, we sent him to see colorectal surgeon.  - Hemorrhoids have stopped bleeding.    - Not checking home PFTs (couldn't afford new adapter)  - No reflux, s/p Nissen.  - Does have BLE edema, stable.  He wears TED hose to help.  - Walking for exercise.  - Has lost 5lbs this past week, he and Angie are working on losing weight.  - Somehow he self increased cellcept to 1000mg  BID and I'm unable to find documentation of this anywhere in the chart.    ROS: a balance of 12 systems was reviewed and is negative other than noted above    PAST MEDICAL HISTORY: ??  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  - No colon ca in family    MEDS: (personally reviewed in EPIC, pertinent meds noted below)     Tac 1/0.5  Cellcept 1000mg  BID (pt self increased)  Pred 7.5    OBJECTIVE DATA:??   PHYSICAL EXAM:  BP 117/74  - Pulse 67  - Temp 36.4 ??C (97.5 ??F) (Tympanic)  - Ht 177.8 cm (5' 10)  - Wt (!) 107.5 kg (237 lb)  - SpO2 99%  - BMI 34.01 kg/m??   Gen: healthy appearing, no acute distress  Derm: scattered ecchymoses on bilateral anterior arms, no petechiae  HEENT: no scleral icterus, 2mm white lesion on lower lip, thrush on roof of mouth and cheek??  CV: RRR, no murmurs or gallops   Pulm: CTA bilaterally, NWOB  Ext: 2+ edema BLE to mid shin.  Neuro: speech fluent, cooperative, EOMI, answering all questions appropriately    PFTs:  (personally reveiewed and interpreted):  Date: ?? FVC (% Pred)?? ?? FEV1 (% Pred)?? ?? FEV1/FVC?? ?? FEF25-75(% Pred)?? ?? TBBx/Results?? ??   08/30/2017 3.69 (77.65) 2.93 (82.1%)  2.74 (95.6%)    07/26/2017 3.58 (76%) 2.86 (80%)  2.74 (95%)    05/18/2017  3.65 (77%)  2.94 (83%)  2.84 (99%)    11/09/16 3.51L  2.81L      08/20/16 3.70 78.5 2.97 83.5  2.82 98.0    06/29/16 3.77 (79.2%) 2.96 (82.7%)  2.75 (93.9%)    03/30/16 3.54 (74.5%) 2.89 (80.5%)  2.93 (100%)    11/07/15 3.55 (74.7%) 2.95 (82.2%)  3.14 (107%)    10/03/15 3.56 (74.9%) 2.94 (82%) 83 3.13 (106.7%)     09/03/15  3.39 (71%)  2.87 (  80%)   3.28 (112%)  post nissen (5 days prior)   ??08/12/15 ???? 3.79 (80%) ???? 3.08 (86%) ?? ????3.12 (107%) ??   ??07/29/2015  3.75?? (79%) ??3.07??(86%) ?? ??3.17??(108%) ??    06/2015  3.71 (85%)  3.08 (85%)   3.27 (110%)    ??05/2015 ??3.49 (73%) ??2.88??(80%) ?? ??3.04??(102%) ??   ??05/08/14?? ??3.60 (73%)?? ??2.91 (78%)?? ?? ??2.74 (88%)?? AOBX   ??04/28/15?? ??3.61 (73%)?? ??2.98 (80%)?? ?? ??3.17 (104%)?? ??   ??04/04/15?? ??3.65 (74%)?? ??3.03 (81%)?? ?? ??3.28 (107%)?? ??   02/28/15?? ??3.57 (75%)?? ??2.98 (82%)?? ?? ??3.16 (106%)?? ??   01/30/15?? 3.74 (78.1)?? 3.13 (86.4)?? 84?? 3.33 (111.8)?? AOBX??   01/17/2015 ?? ??3.8 (79.4%) ?? ??3.2 (88.5%) ?? ??84 ?? ??3.52 (117.9%) ?? ??   12/24/14 ?? 3.59 (75%) ?? 3.06 (85%) ?? ?? 3.35 (112%) ?? A2BX ??   12/20/14 ?? 3.56 (73%) ?? 3.05 (82%) ?? ?? 3.36 (110%) ?? ??   ??   ??   ??   ??     Labs (Personally reviewed, our interpretation is below)    WBC 7.8  H/H 9.8/30.9  ANC 6.1  Cr 3.43, K 3.5  IgG 572    IMAGING:     CXR: (Personally reviewed) :  - not done today.  ????  ASSESSMENT and PLAN??   Angel Nguyen is a 62 y.o. 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  - No CXR today, pt instructed to get this on his way out.  - No DSAs but received Solumedrol for A2Bx in Jan 2018.  HLA panel pending today  - Total IgG >500 today, will continue to check with labs monthly.  - Continue tacrolimus for goal 4-6 with his CKD, currently on 1/0.5  - Pt self increased cellcept, but ANC is tolerating it (6.1 today) and PFTs stable.  Will monitor labs closely as patient did have leukopenia in the past.  - Decrease pred to 5mg  daily to help with BP issues.  - BOS prophylaxis: continue azithro and statin     Proph  - CMV: high risk, off Valcyte with multiple negative CMV viral loads, repeat pending today.  - PCP: off bactrim due to leukopenia and hyperK  - Fungal: off as >44months    Fatigue  - Likely due to big drop in BP, much better controlled now but a rapid change.  - Cut back hydralazine to 12.5mg  BID, continue lasix.  - Check iron panel, ferritin, TSH to r/o other etiologies.    HTN   - Continue HCTZ  - Continue metoprolol 25 mg BID   - Decrease hydral to 12.5mg  BID as above.  - Continue lasix 40mg  daily    Anemia  - CKD contributing.  - Had recent issues with bleeding hemorrhoids but blood loss should not have been significant and stable now.  - Checking iron panel and ferritin as above.    Hemorrhoids:  - Seen by colorectal docs who would prefer no surgical intervention at this time  - rec'd increased fiber intake.  - Currently stable, will monitor.    - Consider referral to GI in the future for [facility] for his internal hemorrhoids.    Chronic kidney disease: GFR nearing 20. Referred to kidney transplant.  - Prograf sensitive...   - Run prograf 4-6 as above.    GERD  - s/p nissen  -  Admits to having reflux when he eats late at night. Educated him on not doing this  - Continue PPI but cut back to once daily    Visual disturbance  - s/p cataract surgery to the R side   - following with optho    Hypogam  - s/p infusion for IVIg 07/28 with last level 664  - IgG today 572.    Neuropathy  - continue gabapentin     Health Maintenance:  - Last derm visit: 01/2017, treating lesion R face  - Last Colonsocopy (if >40): Done at [facility] 2015 prior to txp with 40yr follow up.  Recommended 5 year follow up, patient due in 2020.  - Flu shot x2 fall 2018  - Prevnar 12/2013  - PNA 23 09/2012  - Tdap: Unknown  - Last Dexa: Will schedule to coincide with upcoming visit.   - Last vitamin D: 31 (09/04/14), added on today    - Last HbA1c: 5.4 (02/28/15), added on today.      Patient was assessed and discussed with Dr. Dudley Major    The patient will follow up in 3 months with monthly labs and telephone follow up RE BP.    Sharee Pimple MSN, FNP-C  August 30, 2017 9:55 AM  Lung Transplant NP Coordinator  Pager [phone]

## 2017-08-31 LAB — CMV DNA, QUANTITATIVE, PCR

## 2017-08-31 LAB — HEMOGLOBIN A1C: ESTIMATED AVERAGE GLUCOSE: 108 mg/dL

## 2017-08-31 LAB — ESTIMATED AVERAGE GLUCOSE: Estimated average glucose:MCnc:Pt:Bld:Qn:Estimated from glycated hemoglobin: 108

## 2017-08-31 LAB — CMV QUANT: Lab: 0

## 2017-08-31 LAB — VITAMIN D, TOTAL (25OH): Lab: 33.8

## 2017-08-31 NOTE — Unmapped (Addendum)
6/14 update: per referral in epic from yesterday, patient must fill txp meds at [facility]. Spoke with patient and he chose [address] on [address] in [place]. Since will be going through medicare part B of the advantage plan, [facility] will need new rxs so messaging coordinators today to get new rxs for tac, mycophenolate, and prednisone sent to [facility]. Patient aware that all other maintenance meds can be filled at [facility], however patient will be dis-enrolled for specialty calls and will need to call in his own refills - patient is aware and voiced understanding.Angel Nguyen    [facility] Refill and Clinical Coordination Note  Medication(s): tacrolimus  We have a supply of sandoz mfg tac which patient has been on. Patient does NOT want to switch mfg at this time, K note added to keep patient on sandoz if we can.  Patient got 90ds of bactrim, azith in May so does not need those at this time.  Went over all meds with pt on call today. Besides meds listed in this note, pt denies needing ANYTHING else filled at this time. Pt aware to call us with any needs/concerns/refills if need arises prior to our next scheduled refill call.  Patient also wants a call tomorrow prior to sending out with courier with prices on all meds (since requesting 90ds rxs) - k note added on this as well.      Angel Nguyen, DOB: Apr 30, 1955  Phone: [phone] (home) , Alternate phone contact: N/A  Shipping address: [address]  [place] [address]  Phone or address changes today?: No  All above HIPAA information verified.  Insurance changes? No    Completed refill and clinical call assessment today to schedule patient's medication shipment from the [facility] [phone]).      MEDICATION RECONCILIATION    Confirmed the medication and dosage are correct and have not changed: No, patient reports changes to the regimen as follows: tac is now 1mg  bid (new rx on hold)    Were there any changes to your medication(s) in the past month:  dec pred, inc prav, dec mag, inc hydral - all new rxs on file except mag which pt says gets otc.     ADHERENCE    Is this medicine transplant or covered by Medicare Part B? Yes.    Tacrolimus 0.5 mg   Quantity filled last month: 90   # of tablets left on hand: 5 days left      Did you miss any doses in the past 4 weeks? No missed doses reported.  Adherence counseling provided? Not needed     SIDE EFFECT MANAGEMENT    Are you tolerating your medication?:  Angel Nguyen reports tolerating the medication.  Side effect management discussed: None      Therapy is appropriate and should be continued.    Evidence of clinical benefit: See Epic note from 08/30/17      FINANCIAL/SHIPPING    Delivery Scheduled: Yes, Expected medication delivery date: 09/01/17 via same day [facility] courier   Additional medications refilled: 10 rx total but needing some new rxs: mycophen, tac (new dose rx on hold) lasix 20 (pt wants 90ds), lasix 40 (pt wants 90ds), hydralazine (pt wants 90ds), pravastatin 40, omeprazole, gabapentin, vitamin d (pt wants 90ds)- also mycophenolate (see onboarding note from today as well)    The patient will receive an FSI print out for each medication shipped and additional FDA Medication Guides as required.  Patient education from [place] or [address]  may also be included in the shipment.    Angel Nguyen did not have any additional questions at this time.    Delivery address validated in FSI scheduling system: Yes, address listed above is correct.      We will follow up with patient monthly for standard refill processing and delivery.      Thank you,  Thad Ranger   [facility] Specialty Pharmacist

## 2017-08-31 NOTE — Unmapped (Signed)
[facility]   Patient Onboarding/Medication Counseling    Angel Nguyen is a 62 y.o. male with lung txp who I am counseling today on initiation of therapy. Patient has been taking mycophenolate from outside pharmacy, wants to get with [place] now, declines all education today.    Medication: mycophenolate 500mg     Verified patient's date of birth / HIPAA.      Education Provided: ??    Dose/Administration discussed: patient declined education.    Storage requirements: patient declined education    Side effects / precautions discussed: patient declined education.  Patient will receive a drug information handout with shipment.    Handling precautions / disposal reviewed: patient declined education    Drug Interactions: other medications reviewed and up to date in Epic. patient declined education    Comorbidities/Allergies: reviewed and up to date in Epic.    Verified therapy is appropriate and should continue      Delivery Information    Medication Assistance provided: none    Anticipated copay of $39.31 reviewed with patient. Verified delivery address in FSI and reviewed medication storage requirement.    Scheduled delivery date: 09/01/17 via same day [facility] courier    Explained that we ship using UPS or courier and this shipment will require a signature.      Explained the services we provide at [facility] and that each month we would call to set up refills.  Stressed importance of returning phone calls so that we could ensure they receive their medications in time each month.  Informed patient that we should be setting up refills 7-10 days prior to when they will run out of medication.  Informed patient that welcome packet will be sent.      Patient verbalized understanding of the above information as well as how to contact the pharmacy at [phone] option 4 with any questions/concerns.  The pharmacy is open Monday through Friday 8:30am-4:30pm.  A pharmacist is available 24/7 via pager to answer any clinical questions they may have.        Patient Specific Needs      ? Patient has no physical, cognitive, or cultural barriers.    ? Patient prefers to have medications discussed with  Patient     ? Patient is able to read and understand education materials at a high school level or above.    ? Patient's primary language is  English           Thad Ranger  [facility] Specialty Pharmacist

## 2017-08-31 NOTE — Unmapped (Signed)
Called Angel Nguyen to discuss lab results. Tacrolimus level was Decreased at 2.4 with a goal of 4-6.   Medication regimen is currently 1/0.5.  Dosing at this time increased to 1mg  BID.  Repeat labs scheduled next week.  Ferritin on the low side ~40, will supplement with feraheme.  Plan to f/u with other labwork as results made available.  All questions answered.  Pt verbalized understanding.

## 2017-09-01 LAB — EBV VIRAL LOAD RESULT: Lab: NOT DETECTED

## 2017-09-01 MED ORDER — TACROLIMUS 0.5 MG CAPSULE
ORAL_CAPSULE | Freq: Two times a day (BID) | ORAL | 3 refills | 0.00000 days | Status: CP
Start: 2017-09-01 — End: 2017-09-02

## 2017-09-01 MED FILL — GABAPENTIN/400MG/CAPS: GABAPENTIN/400MG/CAPS | 90 days supply | Qty: 180 | Fill #0

## 2017-09-01 MED FILL — FUROSEMIDE/40MG/TAB: FUROSEMIDE/40MG/TAB | 30 days supply | Qty: 30 | Fill #0

## 2017-09-01 MED FILL — VITAMIN D3/2000UNIT/CAPS: VITAMIN D3/2000UNIT/CAPS | 30 days supply | Qty: 30 | Fill #0

## 2017-09-01 MED FILL — FUROSEMIDE/20MG/TABS: FUROSEMIDE/20MG/TABS | 30 days supply | Qty: 30 | Fill #0

## 2017-09-01 MED FILL — OMEPRAZOLE/40MG/CPDR: OMEPRAZOLE/40MG/CPDR | 90 days supply | Qty: 90 | Fill #0

## 2017-09-01 MED FILL — PRAVASTATIN SODIUM/40MG/TABS: PRAVASTATIN SODIUM/40MG/TABS | 90 days supply | Qty: 90 | Fill #0

## 2017-09-01 NOTE — Unmapped (Signed)
Spoke with pt about rescheduling his appts (Feraheme and Dexa) per his request (Thursday's after 1300).    Pt aware of appts on 09/08/17 and 09/15/17, and verbalized acceptance of rescheduled appts.

## 2017-09-02 MED ORDER — MYCOPHENOLATE MOFETIL 500 MG TABLET
ORAL_TABLET | Freq: Two times a day (BID) | ORAL | 3 refills | 0.00000 days | Status: CP
Start: 2017-09-02 — End: 2017-11-29

## 2017-09-02 MED ORDER — TACROLIMUS 0.5 MG CAPSULE
ORAL_CAPSULE | Freq: Two times a day (BID) | ORAL | 3 refills | 0.00000 days | Status: CP
Start: 2017-09-02 — End: 2017-11-29

## 2017-09-02 MED ORDER — PREDNISONE 5 MG TABLET
ORAL_TABLET | Freq: Every day | ORAL | 3 refills | 0 days | Status: CP
Start: 2017-09-02 — End: 2017-11-29

## 2017-09-05 ENCOUNTER — Ambulatory Visit: Admit: 2017-09-05 | Discharge: 2017-10-04 | Payer: MEDICARE

## 2017-09-05 ENCOUNTER — Ambulatory Visit: Admit: 2017-09-05 | Discharge: 2017-09-18 | Payer: MEDICARE

## 2017-09-05 ENCOUNTER — Ambulatory Visit: Admit: 2017-09-05 | Discharge: 2017-09-18 | Payer: MEDICARE | Attending: Psychologist | Primary: Psychologist

## 2017-09-05 DIAGNOSIS — N186 End stage renal disease: Secondary | ICD-10-CM

## 2017-09-05 DIAGNOSIS — Z01810 Encounter for preprocedural cardiovascular examination: Secondary | ICD-10-CM

## 2017-09-05 DIAGNOSIS — Z942 Lung transplant status: Secondary | ICD-10-CM

## 2017-09-05 DIAGNOSIS — N184 Chronic kidney disease, stage 4 (severe): Principal | ICD-10-CM

## 2017-09-05 DIAGNOSIS — Z01818 Encounter for other preprocedural examination: Principal | ICD-10-CM

## 2017-09-08 ENCOUNTER — Ambulatory Visit: Admit: 2017-09-08 | Discharge: 2017-09-08 | Payer: MEDICARE

## 2017-09-08 DIAGNOSIS — Z942 Lung transplant status: Secondary | ICD-10-CM

## 2017-09-08 DIAGNOSIS — D508 Other iron deficiency anemias: Principal | ICD-10-CM

## 2017-09-14 MED FILL — HYDRALAZINE/25MG/TAB: HYDRALAZINE/25MG/TAB | 30 days supply | Qty: 30 | Fill #0

## 2017-09-15 ENCOUNTER — Ambulatory Visit: Admit: 2017-09-15 | Discharge: 2017-09-16 | Payer: MEDICARE

## 2017-09-15 DIAGNOSIS — Z942 Lung transplant status: Principal | ICD-10-CM

## 2017-09-15 DIAGNOSIS — D508 Other iron deficiency anemias: Principal | ICD-10-CM

## 2017-09-21 MED ORDER — HYDROCHLOROTHIAZIDE 25 MG TABLET
ORAL_TABLET | 3 refills | 0 days | Status: CP
Start: 2017-09-21 — End: 2018-09-02

## 2017-09-29 ENCOUNTER — Ambulatory Visit: Admit: 2017-09-29 | Discharge: 2017-09-29 | Payer: MEDICARE

## 2017-09-29 DIAGNOSIS — Z942 Lung transplant status: Principal | ICD-10-CM

## 2017-10-04 ENCOUNTER — Ambulatory Visit: Admit: 2017-10-04 | Discharge: 2017-10-05 | Payer: MEDICARE

## 2017-10-04 DIAGNOSIS — N3281 Overactive bladder: Principal | ICD-10-CM

## 2017-10-04 MED ORDER — MIRABEGRON ER 25 MG TABLET,EXTENDED RELEASE 24 HR
ORAL_TABLET | Freq: Every day | ORAL | 5 refills | 0.00000 days | Status: CP
Start: 2017-10-04 — End: 2017-10-05

## 2017-10-05 MED ORDER — FESOTERODINE ER 4 MG TABLET,EXTENDED RELEASE 24 HR
ORAL_TABLET | Freq: Every day | ORAL | 5 refills | 0.00000 days | Status: CP
Start: 2017-10-05 — End: 2017-10-05

## 2017-10-05 MED ORDER — FESOTERODINE ER 4 MG TABLET,EXTENDED RELEASE 24 HR: 4 mg | tablet | Freq: Every day | 5 refills | 0 days | Status: AC

## 2017-10-20 ENCOUNTER — Ambulatory Visit: Admit: 2017-10-20 | Discharge: 2017-10-21 | Payer: MEDICARE

## 2017-10-20 DIAGNOSIS — Z942 Lung transplant status: Principal | ICD-10-CM

## 2017-10-31 MED ORDER — OMEPRAZOLE 40 MG CAPSULE,DELAYED RELEASE
ORAL_CAPSULE | Freq: Every day | ORAL | 3 refills | 0.00000 days | Status: CP
Start: 2017-10-31 — End: 2017-11-04

## 2017-10-31 MED ORDER — PRAVASTATIN 40 MG TABLET
ORAL_TABLET | Freq: Every day | ORAL | 3 refills | 0 days | Status: CP
Start: 2017-10-31 — End: 2018-02-14
  Filled 2017-11-02: qty 90, 90d supply, fill #0

## 2017-11-02 MED FILL — PRAVASTATIN 40 MG TABLET: 90 days supply | Qty: 90 | Fill #0 | Status: AC

## 2017-11-04 ENCOUNTER — Ambulatory Visit: Admit: 2017-11-04 | Discharge: 2017-11-05 | Payer: MEDICARE

## 2017-11-04 DIAGNOSIS — R600 Localized edema: Secondary | ICD-10-CM

## 2017-11-04 DIAGNOSIS — N184 Chronic kidney disease, stage 4 (severe): Secondary | ICD-10-CM

## 2017-11-04 DIAGNOSIS — N189 Chronic kidney disease, unspecified: Principal | ICD-10-CM

## 2017-11-04 DIAGNOSIS — D631 Anemia in chronic kidney disease: Secondary | ICD-10-CM

## 2017-11-04 DIAGNOSIS — E669 Obesity, unspecified: Secondary | ICD-10-CM

## 2017-11-04 DIAGNOSIS — I10 Essential (primary) hypertension: Secondary | ICD-10-CM

## 2017-11-04 MED ORDER — OMEPRAZOLE 40 MG CAPSULE,DELAYED RELEASE
ORAL_CAPSULE | Freq: Two times a day (BID) | ORAL | 3 refills | 0 days | Status: CP
Start: 2017-11-04 — End: 2017-11-29
  Filled 2017-11-07: qty 60, 30d supply, fill #0

## 2017-11-07 MED FILL — OMEPRAZOLE 40 MG CAPSULE,DELAYED RELEASE: 30 days supply | Qty: 60 | Fill #0 | Status: AC

## 2017-11-29 ENCOUNTER — Ambulatory Visit: Admit: 2017-11-29 | Discharge: 2017-11-29 | Payer: MEDICARE | Attending: Family | Primary: Family

## 2017-11-29 ENCOUNTER — Ambulatory Visit: Admit: 2017-11-29 | Discharge: 2017-11-29 | Payer: MEDICARE

## 2017-11-29 ENCOUNTER — Institutional Professional Consult (permissible substitution): Admit: 2017-11-29 | Discharge: 2017-11-29 | Payer: MEDICARE

## 2017-11-29 DIAGNOSIS — Z942 Lung transplant status: Principal | ICD-10-CM

## 2017-11-29 DIAGNOSIS — D631 Anemia in chronic kidney disease: Secondary | ICD-10-CM

## 2017-11-29 DIAGNOSIS — E669 Obesity, unspecified: Secondary | ICD-10-CM

## 2017-11-29 DIAGNOSIS — I10 Essential (primary) hypertension: Secondary | ICD-10-CM

## 2017-11-29 DIAGNOSIS — R600 Localized edema: Secondary | ICD-10-CM

## 2017-11-29 DIAGNOSIS — N189 Chronic kidney disease, unspecified: Principal | ICD-10-CM

## 2017-11-29 DIAGNOSIS — N184 Chronic kidney disease, stage 4 (severe): Secondary | ICD-10-CM

## 2017-11-29 MED ORDER — OMEPRAZOLE 40 MG CAPSULE,DELAYED RELEASE
ORAL_CAPSULE | Freq: Two times a day (BID) | ORAL | 3 refills | 0.00000 days | Status: CP
Start: 2017-11-29 — End: 2018-09-15

## 2017-11-29 MED ORDER — MYCOPHENOLATE MOFETIL 500 MG TABLET
ORAL_TABLET | Freq: Two times a day (BID) | ORAL | 3 refills | 0.00000 days | Status: CP
Start: 2017-11-29 — End: ?

## 2017-11-29 MED ORDER — PREDNISONE 5 MG TABLET
ORAL_TABLET | Freq: Every day | ORAL | 3 refills | 0 days | Status: CP
Start: 2017-11-29 — End: ?

## 2017-11-29 MED ORDER — TACROLIMUS 0.5 MG CAPSULE
ORAL_CAPSULE | ORAL | 3 refills | 0 days
Start: 2017-11-29 — End: 2018-02-28

## 2017-11-29 MED ORDER — MAGNESIUM OXIDE 400 MG (241.3 MG MAGNESIUM) TABLET
ORAL_TABLET | Freq: Every day | ORAL | 11 refills | 0.00000 days
Start: 2017-11-29 — End: 2017-11-29

## 2017-12-05 MED ORDER — METOPROLOL TARTRATE 25 MG TABLET
3 refills | 0 days | Status: CP
Start: 2017-12-05 — End: 2018-12-05

## 2017-12-08 ENCOUNTER — Ambulatory Visit: Admit: 2017-12-08 | Discharge: 2017-12-09 | Payer: MEDICARE

## 2017-12-08 DIAGNOSIS — Z01818 Encounter for other preprocedural examination: Secondary | ICD-10-CM

## 2017-12-08 DIAGNOSIS — Z942 Lung transplant status: Principal | ICD-10-CM

## 2017-12-19 MED FILL — PRAVASTATIN 40 MG TABLET: ORAL | 90 days supply | Qty: 90 | Fill #1

## 2017-12-19 MED FILL — PRAVASTATIN 40 MG TABLET: 90 days supply | Qty: 90 | Fill #1 | Status: AC

## 2018-01-12 ENCOUNTER — Ambulatory Visit: Admit: 2018-01-12 | Discharge: 2018-01-13 | Payer: MEDICARE

## 2018-01-12 DIAGNOSIS — Z942 Lung transplant status: Principal | ICD-10-CM

## 2018-01-12 DIAGNOSIS — Z01818 Encounter for other preprocedural examination: Secondary | ICD-10-CM

## 2018-01-19 ENCOUNTER — Ambulatory Visit: Admit: 2018-01-19 | Discharge: 2018-01-20 | Payer: MEDICARE

## 2018-01-19 DIAGNOSIS — Z942 Lung transplant status: Principal | ICD-10-CM

## 2018-02-14 MED ORDER — PRAVASTATIN 40 MG TABLET
ORAL_TABLET | Freq: Every day | ORAL | 3 refills | 0.00000 days | Status: CP
Start: 2018-02-14 — End: 2019-02-14

## 2018-02-15 ENCOUNTER — Ambulatory Visit: Admit: 2018-02-15 | Discharge: 2018-02-16 | Payer: MEDICARE

## 2018-02-15 DIAGNOSIS — Z942 Lung transplant status: Principal | ICD-10-CM

## 2018-02-15 DIAGNOSIS — Z01818 Encounter for other preprocedural examination: Secondary | ICD-10-CM

## 2018-02-20 MED ORDER — MAGNESIUM OXIDE 400 MG (241.3 MG MAGNESIUM) TABLET
ORAL_TABLET | Freq: Two times a day (BID) | ORAL | 3 refills | 0.00000 days | Status: CP
Start: 2018-02-20 — End: 2018-05-21

## 2018-02-21 ENCOUNTER — Ambulatory Visit: Admit: 2018-02-21 | Discharge: 2018-02-22 | Payer: MEDICARE

## 2018-02-21 DIAGNOSIS — N184 Chronic kidney disease, stage 4 (severe): Secondary | ICD-10-CM

## 2018-02-21 DIAGNOSIS — D631 Anemia in chronic kidney disease: Secondary | ICD-10-CM

## 2018-02-28 ENCOUNTER — Ambulatory Visit: Admit: 2018-02-28 | Discharge: 2018-02-28 | Payer: MEDICARE

## 2018-02-28 ENCOUNTER — Ambulatory Visit: Admit: 2018-02-28 | Discharge: 2018-02-28 | Payer: MEDICARE | Attending: Family | Primary: Family

## 2018-02-28 DIAGNOSIS — Z942 Lung transplant status: Principal | ICD-10-CM

## 2018-02-28 DIAGNOSIS — D649 Anemia, unspecified: Secondary | ICD-10-CM

## 2018-02-28 DIAGNOSIS — Z01818 Encounter for other preprocedural examination: Secondary | ICD-10-CM

## 2018-02-28 DIAGNOSIS — M109 Gout, unspecified: Secondary | ICD-10-CM

## 2018-02-28 DIAGNOSIS — R52 Pain, unspecified: Principal | ICD-10-CM

## 2018-02-28 MED ORDER — GABAPENTIN 400 MG CAPSULE
Freq: Every evening | ORAL | 0 days
Start: 2018-02-28 — End: 2018-03-13

## 2018-02-28 MED ORDER — SILDENAFIL 50 MG TABLET
ORAL_TABLET | Freq: Every day | ORAL | 3 refills | 0 days | Status: CP | PRN
Start: 2018-02-28 — End: 2019-02-28

## 2018-03-06 ENCOUNTER — Institutional Professional Consult (permissible substitution): Admit: 2018-03-06 | Discharge: 2018-03-07 | Payer: MEDICARE

## 2018-03-06 DIAGNOSIS — D631 Anemia in chronic kidney disease: Secondary | ICD-10-CM

## 2018-03-06 DIAGNOSIS — N183 Chronic kidney disease, stage 3 (moderate): Secondary | ICD-10-CM

## 2018-03-06 MED ORDER — CHOLECALCIFEROL (VITAMIN D3) 50 MCG (2,000 UNIT) CAPSULE
ORAL_CAPSULE | Freq: Two times a day (BID) | ORAL | 11 refills | 0 days
Start: 2018-03-06 — End: 2019-03-06

## 2018-03-13 MED ORDER — GABAPENTIN 400 MG CAPSULE
ORAL_CAPSULE | Freq: Every evening | ORAL | 3 refills | 0.00000 days
Start: 2018-03-13 — End: 2018-03-16

## 2018-03-16 ENCOUNTER — Ambulatory Visit: Admit: 2018-03-16 | Discharge: 2018-03-17 | Payer: MEDICARE

## 2018-03-16 DIAGNOSIS — S161XXA Strain of muscle, fascia and tendon at neck level, initial encounter: Secondary | ICD-10-CM

## 2018-03-16 DIAGNOSIS — M62838 Other muscle spasm: Principal | ICD-10-CM

## 2018-03-16 MED ORDER — TIZANIDINE 4 MG TABLET
ORAL_TABLET | Freq: Four times a day (QID) | ORAL | 0 refills | 0 days | Status: CP | PRN
Start: 2018-03-16 — End: 2018-03-20

## 2018-03-16 MED ORDER — GABAPENTIN 400 MG CAPSULE
ORAL_CAPSULE | Freq: Every evening | ORAL | 3 refills | 0 days | Status: CP
Start: 2018-03-16 — End: 2018-06-14

## 2018-03-19 ENCOUNTER — Ambulatory Visit: Admit: 2018-03-19 | Discharge: 2018-03-19 | Disposition: A | Discharge status: Home / Self Care | Payer: MEDICARE

## 2018-03-19 MED ORDER — OXYCODONE 5 MG CAPSULE
ORAL_CAPSULE | ORAL | 0 refills | 0 days | Status: CP | PRN
Start: 2018-03-19 — End: 2018-03-20

## 2018-03-20 ENCOUNTER — Ambulatory Visit: Admit: 2018-03-20 | Discharge: 2018-03-21 | Payer: MEDICARE

## 2018-03-20 ENCOUNTER — Ambulatory Visit: Admit: 2018-03-20 | Discharge: 2018-03-21 | Payer: MEDICARE | Attending: Internal Medicine | Primary: Internal Medicine

## 2018-03-20 DIAGNOSIS — Z942 Lung transplant status: Principal | ICD-10-CM

## 2018-03-20 DIAGNOSIS — Z01818 Encounter for other preprocedural examination: Secondary | ICD-10-CM

## 2018-03-20 DIAGNOSIS — N189 Chronic kidney disease, unspecified: Principal | ICD-10-CM

## 2018-03-20 DIAGNOSIS — M5440 Lumbago with sciatica, unspecified side: Secondary | ICD-10-CM

## 2018-03-20 DIAGNOSIS — K5901 Slow transit constipation: Principal | ICD-10-CM

## 2018-03-20 MED ORDER — POLYETHYLENE GLYCOL 3350 17 GRAM ORAL POWDER PACKET
PACK | Freq: Two times a day (BID) | ORAL | 0 refills | 0.00000 days | Status: CP
Start: 2018-03-20 — End: 2018-04-19

## 2018-03-20 MED ORDER — DICLOFENAC 1 % TOPICAL GEL
Freq: Four times a day (QID) | TOPICAL | 0 refills | 0.00000 days | Status: CP
Start: 2018-03-20 — End: 2018-08-29

## 2018-03-23 MED ORDER — TIZANIDINE 4 MG TABLET
ORAL_TABLET | Freq: Three times a day (TID) | ORAL | 0 refills | 0 days | Status: CP | PRN
Start: 2018-03-23 — End: 2018-08-29

## 2018-03-29 MED ORDER — TRAZODONE 100 MG TABLET
ORAL_TABLET | Freq: Every evening | ORAL | 3 refills | 0.00000 days | Status: CP
Start: 2018-03-29 — End: 2018-04-28

## 2018-03-29 MED ORDER — LORAZEPAM 0.5 MG TABLET
ORAL_TABLET | Freq: Two times a day (BID) | ORAL | 0 refills | 0 days | Status: CP | PRN
Start: 2018-03-29 — End: 2018-04-05

## 2018-04-03 MED ORDER — MIRTAZAPINE 15 MG TABLET
ORAL_TABLET | Freq: Every evening | ORAL | 0 refills | 0 days | Status: CP
Start: 2018-04-03 — End: 2018-08-29

## 2018-04-05 ENCOUNTER — Institutional Professional Consult (permissible substitution): Admit: 2018-04-05 | Discharge: 2018-04-06 | Payer: MEDICARE

## 2018-04-05 DIAGNOSIS — N183 Chronic kidney disease, stage 3 (moderate): Principal | ICD-10-CM

## 2018-04-11 MED ORDER — AZITHROMYCIN 250 MG TABLET
ORAL_TABLET | Freq: Every day | ORAL | 5 refills | 0.00000 days | Status: CP
Start: 2018-04-11 — End: 2018-07-10

## 2018-04-12 ENCOUNTER — Ambulatory Visit: Admit: 2018-04-12 | Discharge: 2018-04-13 | Payer: MEDICARE

## 2018-04-12 DIAGNOSIS — M549 Dorsalgia, unspecified: Principal | ICD-10-CM

## 2018-04-12 DIAGNOSIS — Z942 Lung transplant status: Principal | ICD-10-CM

## 2018-04-12 DIAGNOSIS — R0789 Other chest pain: Secondary | ICD-10-CM

## 2018-04-12 DIAGNOSIS — M7918 Myalgia, other site: Secondary | ICD-10-CM

## 2018-04-14 MED ORDER — FESOTERODINE ER 4 MG TABLET,EXTENDED RELEASE 24 HR
ORAL_TABLET | Freq: Every day | ORAL | 1 refills | 0.00000 days | Status: CP
Start: 2018-04-14 — End: 2018-04-14

## 2018-04-14 MED ORDER — FESOTERODINE ER 4 MG TABLET,EXTENDED RELEASE 24 HR: 4 mg | tablet | Freq: Every day | 1 refills | 0 days | Status: AC

## 2018-04-20 ENCOUNTER — Ambulatory Visit: Admit: 2018-04-20 | Discharge: 2018-04-20 | Payer: MEDICARE

## 2018-04-20 ENCOUNTER — Encounter: Admit: 2018-04-20 | Discharge: 2018-04-20 | Payer: MEDICARE | Attending: Surgery | Primary: Surgery

## 2018-04-20 ENCOUNTER — Encounter: Admit: 2018-04-20 | Discharge: 2018-04-20 | Payer: MEDICARE | Attending: Family | Primary: Family

## 2018-04-20 DIAGNOSIS — Z01818 Encounter for other preprocedural examination: Principal | ICD-10-CM

## 2018-05-22 DIAGNOSIS — Z942 Lung transplant status: Principal | ICD-10-CM

## 2018-05-26 DIAGNOSIS — Z942 Lung transplant status: Principal | ICD-10-CM

## 2018-05-29 DIAGNOSIS — Z942 Lung transplant status: Principal | ICD-10-CM

## 2018-06-01 ENCOUNTER — Ambulatory Visit: Admit: 2018-06-01 | Discharge: 2018-06-02 | Payer: MEDICARE

## 2018-06-01 DIAGNOSIS — Z01818 Encounter for other preprocedural examination: Principal | ICD-10-CM

## 2018-06-01 DIAGNOSIS — R109 Unspecified abdominal pain: Principal | ICD-10-CM

## 2018-06-01 DIAGNOSIS — Z942 Lung transplant status: Principal | ICD-10-CM

## 2018-06-01 DIAGNOSIS — Z4824 Encounter for aftercare following lung transplant: Principal | ICD-10-CM

## 2018-06-05 DIAGNOSIS — Z942 Lung transplant status: Principal | ICD-10-CM

## 2018-07-14 ENCOUNTER — Ambulatory Visit: Admit: 2018-07-14 | Discharge: 2018-07-15 | Payer: MEDICARE

## 2018-07-14 DIAGNOSIS — Z942 Lung transplant status: Principal | ICD-10-CM

## 2018-08-07 MED ORDER — HYDRALAZINE 25 MG TABLET
ORAL_TABLET | Freq: Two times a day (BID) | ORAL | 5 refills | 0 days | Status: CP
Start: 2018-08-07 — End: 2018-11-05

## 2018-08-07 MED ORDER — FUROSEMIDE 20 MG TABLET
ORAL_TABLET | Freq: Every day | ORAL | 11 refills | 0 days | Status: CP
Start: 2018-08-07 — End: 2018-08-29

## 2018-08-17 ENCOUNTER — Encounter: Admit: 2018-08-17 | Discharge: 2018-08-17 | Payer: MEDICARE | Attending: Family | Primary: Family

## 2018-08-17 ENCOUNTER — Ambulatory Visit: Admit: 2018-08-17 | Discharge: 2018-08-17 | Payer: MEDICARE

## 2018-08-17 DIAGNOSIS — Z01818 Encounter for other preprocedural examination: Principal | ICD-10-CM

## 2018-08-17 DIAGNOSIS — Z4824 Encounter for aftercare following lung transplant: Principal | ICD-10-CM

## 2018-08-29 ENCOUNTER — Ambulatory Visit: Admit: 2018-08-29 | Discharge: 2018-08-30 | Payer: MEDICARE | Attending: Family | Primary: Family

## 2018-08-29 ENCOUNTER — Ambulatory Visit: Admit: 2018-08-29 | Discharge: 2018-08-30 | Payer: MEDICARE

## 2018-08-29 ENCOUNTER — Ambulatory Visit: Admit: 2018-08-29 | Discharge: 2018-08-30 | Payer: MEDICARE | Attending: Clinical | Primary: Clinical

## 2018-08-29 DIAGNOSIS — Z942 Lung transplant status: Principal | ICD-10-CM

## 2018-08-29 DIAGNOSIS — M8580 Other specified disorders of bone density and structure, unspecified site: Secondary | ICD-10-CM

## 2018-09-02 MED ORDER — HYDROCHLOROTHIAZIDE 25 MG TABLET
3 refills | 0 days | Status: CP
Start: 2018-09-02 — End: ?

## 2018-09-15 MED ORDER — OMEPRAZOLE 40 MG CAPSULE,DELAYED RELEASE
0 refills | 0 days | Status: CP
Start: 2018-09-15 — End: ?

## 2018-09-25 ENCOUNTER — Ambulatory Visit: Admit: 2018-09-25 | Discharge: 2018-09-26 | Payer: MEDICARE

## 2018-09-25 DIAGNOSIS — I10 Essential (primary) hypertension: Secondary | ICD-10-CM

## 2018-09-25 DIAGNOSIS — N184 Chronic kidney disease, stage 4 (severe): Secondary | ICD-10-CM

## 2018-09-25 DIAGNOSIS — E213 Hyperparathyroidism, unspecified: Secondary | ICD-10-CM

## 2018-09-25 DIAGNOSIS — D631 Anemia in chronic kidney disease: Secondary | ICD-10-CM

## 2018-09-27 MED ORDER — TOVIAZ 4 MG TABLET,EXTENDED RELEASE
0 refills | 0 days | Status: CP
Start: 2018-09-27 — End: ?

## 2018-09-29 ENCOUNTER — Ambulatory Visit: Admit: 2018-09-29 | Discharge: 2018-09-30 | Payer: MEDICARE

## 2018-09-29 DIAGNOSIS — Z942 Lung transplant status: Principal | ICD-10-CM

## 2018-09-29 DIAGNOSIS — I10 Essential (primary) hypertension: Secondary | ICD-10-CM

## 2018-09-29 DIAGNOSIS — E213 Hyperparathyroidism, unspecified: Secondary | ICD-10-CM

## 2018-09-29 DIAGNOSIS — D631 Anemia in chronic kidney disease: Secondary | ICD-10-CM

## 2018-09-29 DIAGNOSIS — N184 Chronic kidney disease, stage 4 (severe): Secondary | ICD-10-CM

## 2018-10-16 MED ORDER — COLCRYS 0.6 MG TABLET
ORAL_TABLET | Freq: Every day | ORAL | 11 refills | 30.00000 days | Status: CP | PRN
Start: 2018-10-16 — End: 2019-10-11

## 2018-11-02 ENCOUNTER — Ambulatory Visit: Admit: 2018-11-02 | Discharge: 2018-11-03 | Payer: MEDICARE

## 2018-11-02 DIAGNOSIS — Z942 Lung transplant status: Principal | ICD-10-CM

## 2018-11-28 DIAGNOSIS — Z942 Lung transplant status: Secondary | ICD-10-CM

## 2018-12-02 DIAGNOSIS — Z942 Lung transplant status: Secondary | ICD-10-CM

## 2018-12-05 ENCOUNTER — Ambulatory Visit: Admit: 2018-12-05 | Discharge: 2018-12-06 | Payer: MEDICARE

## 2018-12-05 ENCOUNTER — Telehealth: Admit: 2018-12-05 | Discharge: 2018-12-06 | Payer: MEDICARE | Attending: Family | Primary: Family

## 2018-12-05 DIAGNOSIS — Z9189 Other specified personal risk factors, not elsewhere classified: Secondary | ICD-10-CM

## 2018-12-05 DIAGNOSIS — K5900 Constipation, unspecified: Secondary | ICD-10-CM

## 2018-12-05 DIAGNOSIS — G629 Polyneuropathy, unspecified: Secondary | ICD-10-CM

## 2018-12-05 DIAGNOSIS — N189 Chronic kidney disease, unspecified: Secondary | ICD-10-CM

## 2018-12-05 DIAGNOSIS — Z942 Lung transplant status: Secondary | ICD-10-CM

## 2018-12-05 DIAGNOSIS — M549 Dorsalgia, unspecified: Secondary | ICD-10-CM

## 2018-12-05 DIAGNOSIS — M25561 Pain in right knee: Secondary | ICD-10-CM

## 2018-12-05 DIAGNOSIS — D631 Anemia in chronic kidney disease: Secondary | ICD-10-CM

## 2018-12-05 DIAGNOSIS — K219 Gastro-esophageal reflux disease without esophagitis: Secondary | ICD-10-CM

## 2018-12-05 DIAGNOSIS — K648 Other hemorrhoids: Secondary | ICD-10-CM

## 2018-12-05 DIAGNOSIS — I129 Hypertensive chronic kidney disease with stage 1 through stage 4 chronic kidney disease, or unspecified chronic kidney disease: Secondary | ICD-10-CM

## 2018-12-05 MED ORDER — MYCOPHENOLATE MOFETIL 500 MG TABLET
ORAL_TABLET | Freq: Two times a day (BID) | ORAL | 5 refills | 90 days | Status: CP
Start: 2018-12-05 — End: 2019-03-05

## 2018-12-05 MED ORDER — PREDNISONE 5 MG TABLET
ORAL_TABLET | Freq: Every day | ORAL | 5 refills | 90.00000 days | Status: CP
Start: 2018-12-05 — End: 2019-03-05

## 2018-12-07 ENCOUNTER — Ambulatory Visit: Admit: 2018-12-07 | Discharge: 2018-12-08 | Payer: MEDICARE

## 2018-12-07 DIAGNOSIS — Z9189 Other specified personal risk factors, not elsewhere classified: Secondary | ICD-10-CM

## 2018-12-07 DIAGNOSIS — Z942 Lung transplant status: Secondary | ICD-10-CM

## 2018-12-07 DIAGNOSIS — Z79899 Other long term (current) drug therapy: Secondary | ICD-10-CM

## 2018-12-07 DIAGNOSIS — Z4824 Encounter for aftercare following lung transplant: Secondary | ICD-10-CM

## 2018-12-12 ENCOUNTER — Ambulatory Visit: Admit: 2018-12-12 | Discharge: 2018-12-13 | Payer: MEDICARE

## 2018-12-12 ENCOUNTER — Institutional Professional Consult (permissible substitution): Admit: 2018-12-12 | Discharge: 2018-12-13 | Payer: MEDICARE

## 2018-12-12 DIAGNOSIS — Z942 Lung transplant status: Secondary | ICD-10-CM

## 2018-12-12 DIAGNOSIS — M85852 Other specified disorders of bone density and structure, left thigh: Secondary | ICD-10-CM

## 2018-12-12 DIAGNOSIS — Z23 Encounter for immunization: Secondary | ICD-10-CM

## 2018-12-12 DIAGNOSIS — M8588 Other specified disorders of bone density and structure, other site: Secondary | ICD-10-CM

## 2018-12-12 DIAGNOSIS — Z7952 Long term (current) use of systemic steroids: Secondary | ICD-10-CM

## 2018-12-14 DIAGNOSIS — M8580 Other specified disorders of bone density and structure, unspecified site: Secondary | ICD-10-CM

## 2018-12-14 DIAGNOSIS — N189 Chronic kidney disease, unspecified: Secondary | ICD-10-CM

## 2018-12-14 MED ORDER — CALCIUM CARBONATE 500 MG CALCIUM (1,250 MG) TABLET
ORAL_TABLET | Freq: Two times a day (BID) | ORAL | 5 refills | 90.00000 days | Status: CP
Start: 2018-12-14 — End: 2019-03-14

## 2019-01-09 ENCOUNTER — Telehealth: Admit: 2019-01-09 | Discharge: 2019-01-10 | Payer: MEDICARE | Attending: Internal Medicine | Primary: Internal Medicine

## 2019-01-11 ENCOUNTER — Ambulatory Visit: Admit: 2019-01-11 | Discharge: 2019-01-12 | Payer: MEDICARE

## 2019-02-22 ENCOUNTER — Ambulatory Visit: Admit: 2019-02-22 | Discharge: 2019-02-23 | Payer: MEDICARE

## 2019-02-24 DIAGNOSIS — R52 Pain, unspecified: Principal | ICD-10-CM

## 2019-02-26 MED ORDER — GABAPENTIN 400 MG CAPSULE
Freq: Every evening | ORAL | 4 refills | 90 days | Status: CP
Start: 2019-02-26 — End: 2019-05-27

## 2019-02-26 MED ORDER — TACROLIMUS 0.5 MG CAPSULE
ORAL_CAPSULE | ORAL | 4 refills | 90 days | Status: CP
Start: 2019-02-26 — End: 2019-05-27

## 2019-02-26 MED ORDER — PRAVASTATIN 40 MG TABLET
ORAL_TABLET | Freq: Every day | ORAL | 3 refills | 90.00000 days | Status: CP
Start: 2019-02-26 — End: 2020-02-26

## 2019-02-26 MED ORDER — HYDRALAZINE 25 MG TABLET
ORAL_TABLET | Freq: Two times a day (BID) | ORAL | 5 refills | 90 days | Status: CP
Start: 2019-02-26 — End: 2019-05-27

## 2019-02-26 MED ORDER — OMEPRAZOLE 40 MG CAPSULE,DELAYED RELEASE
0 refills | 0 days | Status: CP
Start: 2019-02-26 — End: ?

## 2019-02-26 MED ORDER — METOPROLOL TARTRATE 25 MG TABLET
ORAL_TABLET | Freq: Two times a day (BID) | ORAL | 0 refills | 90 days | Status: CP
Start: 2019-02-26 — End: 2019-05-27

## 2019-02-28 ENCOUNTER — Ambulatory Visit: Admit: 2019-02-28 | Discharge: 2019-03-01 | Payer: MEDICARE | Attending: Dermatology | Primary: Dermatology

## 2019-02-28 DIAGNOSIS — L570 Actinic keratosis: Principal | ICD-10-CM

## 2019-02-28 DIAGNOSIS — D485 Neoplasm of uncertain behavior of skin: Principal | ICD-10-CM

## 2019-02-28 MED ORDER — FLUOROURACIL 5 % TOPICAL CREAM
Freq: Two times a day (BID) | TOPICAL | 0 refills | 0.00000 days | Status: CP
Start: 2019-02-28 — End: 2020-02-28

## 2019-03-05 ENCOUNTER — Institutional Professional Consult (permissible substitution): Admit: 2019-03-05 | Discharge: 2019-03-06 | Payer: MEDICARE

## 2019-03-05 DIAGNOSIS — E213 Hyperparathyroidism, unspecified: Principal | ICD-10-CM

## 2019-03-05 DIAGNOSIS — D631 Anemia in chronic kidney disease: Secondary | ICD-10-CM

## 2019-03-05 DIAGNOSIS — N184 Chronic kidney disease, stage 4 (severe): Principal | ICD-10-CM

## 2019-03-05 DIAGNOSIS — I10 Essential (primary) hypertension: Principal | ICD-10-CM

## 2019-03-05 DIAGNOSIS — D649 Anemia, unspecified: Principal | ICD-10-CM

## 2019-03-08 DIAGNOSIS — D099 Carcinoma in situ, unspecified: Principal | ICD-10-CM

## 2019-03-26 DIAGNOSIS — Z942 Lung transplant status: Principal | ICD-10-CM

## 2019-03-27 ENCOUNTER — Ambulatory Visit: Admit: 2019-03-27 | Discharge: 2019-03-27 | Payer: MEDICARE | Attending: Family | Primary: Family

## 2019-03-27 ENCOUNTER — Ambulatory Visit: Admit: 2019-03-27 | Discharge: 2019-03-27 | Payer: MEDICARE

## 2019-03-27 DIAGNOSIS — Z942 Lung transplant status: Principal | ICD-10-CM

## 2019-05-28 MED ORDER — OMEPRAZOLE 40 MG CAPSULE,DELAYED RELEASE
ORAL_CAPSULE | 0 refills | 0 days | Status: CP
Start: 2019-05-28 — End: ?

## 2019-05-28 MED ORDER — AZITHROMYCIN 250 MG TABLET
ORAL_TABLET | 0 refills | 0 days | Status: CP
Start: 2019-05-28 — End: ?

## 2019-05-28 MED ORDER — METOPROLOL TARTRATE 25 MG TABLET
ORAL_TABLET | 0 refills | 0 days | Status: CP
Start: 2019-05-28 — End: ?

## 2019-06-01 ENCOUNTER — Ambulatory Visit: Admit: 2019-06-01 | Discharge: 2019-06-02 | Payer: MEDICARE

## 2019-06-04 DIAGNOSIS — R52 Pain, unspecified: Principal | ICD-10-CM

## 2019-06-04 DIAGNOSIS — Z942 Lung transplant status: Principal | ICD-10-CM

## 2019-06-04 MED ORDER — TACROLIMUS 0.5 MG CAPSULE, IMMEDIATE-RELEASE
ORAL_CAPSULE | ORAL | 3 refills | 90 days | Status: CP
Start: 2019-06-04 — End: 2019-09-02

## 2019-06-04 MED ORDER — HYDRALAZINE 25 MG TABLET
ORAL_TABLET | Freq: Two times a day (BID) | ORAL | 3 refills | 90.00000 days | Status: CP
Start: 2019-06-04 — End: 2019-09-02

## 2019-06-04 MED ORDER — MYCOPHENOLATE MOFETIL 500 MG TABLET
ORAL_TABLET | Freq: Two times a day (BID) | ORAL | 3 refills | 90.00000 days | Status: CP
Start: 2019-06-04 — End: 2019-09-02

## 2019-06-04 MED ORDER — PREDNISONE 5 MG TABLET
ORAL_TABLET | Freq: Every day | ORAL | 3 refills | 90 days | Status: CP
Start: 2019-06-04 — End: 2019-09-02

## 2019-06-04 MED ORDER — GABAPENTIN 400 MG CAPSULE
ORAL_CAPSULE | Freq: Every evening | ORAL | 2 refills | 30 days | Status: CP
Start: 2019-06-04 — End: 2019-07-04

## 2019-06-08 DIAGNOSIS — F321 Major depressive disorder, single episode, moderate: Principal | ICD-10-CM

## 2019-06-08 MED ORDER — SERTRALINE 50 MG TABLET
ORAL_TABLET | Freq: Every day | ORAL | 3 refills | 90.00000 days | Status: CP
Start: 2019-06-08 — End: 2020-06-07

## 2019-06-08 MED ORDER — TRAZODONE 50 MG TABLET
ORAL_TABLET | Freq: Every evening | ORAL | 3 refills | 90.00000 days | Status: CP
Start: 2019-06-08 — End: 2019-07-08

## 2019-06-12 DIAGNOSIS — Z7952 Long term (current) use of systemic steroids: Principal | ICD-10-CM

## 2019-06-16 ENCOUNTER — Ambulatory Visit: Admit: 2019-06-16 | Discharge: 2019-06-16 | Payer: MEDICARE

## 2019-06-27 ENCOUNTER — Ambulatory Visit
Admit: 2019-06-27 | Discharge: 2019-06-28 | Payer: MEDICARE | Attending: MOHS-Micrographic Surgery | Primary: MOHS-Micrographic Surgery

## 2019-07-04 DIAGNOSIS — Z942 Lung transplant status: Principal | ICD-10-CM

## 2019-07-06 ENCOUNTER — Institutional Professional Consult (permissible substitution): Admit: 2019-07-06 | Discharge: 2019-07-19 | Payer: MEDICARE

## 2019-07-06 ENCOUNTER — Ambulatory Visit: Admit: 2019-07-06 | Discharge: 2019-07-19 | Payer: MEDICARE

## 2019-07-06 ENCOUNTER — Ambulatory Visit: Admit: 2019-07-06 | Discharge: 2019-07-19 | Payer: MEDICARE | Attending: Internal Medicine | Primary: Internal Medicine

## 2019-07-06 DIAGNOSIS — R52 Pain, unspecified: Principal | ICD-10-CM

## 2019-07-06 DIAGNOSIS — Z942 Lung transplant status: Principal | ICD-10-CM

## 2019-07-06 DIAGNOSIS — F321 Major depressive disorder, single episode, moderate: Principal | ICD-10-CM

## 2019-07-06 MED ORDER — ALLOPURINOL 100 MG TABLET
ORAL_TABLET | Freq: Every day | ORAL | 3 refills | 90.00000 days | Status: CP
Start: 2019-07-06 — End: 2020-07-05

## 2019-07-06 MED ORDER — METOPROLOL TARTRATE 25 MG TABLET
ORAL_TABLET | Freq: Two times a day (BID) | ORAL | 3 refills | 90 days | Status: CP
Start: 2019-07-06 — End: ?

## 2019-07-06 MED ORDER — GABAPENTIN 400 MG CAPSULE
ORAL_CAPSULE | Freq: Every evening | ORAL | 3 refills | 90 days | Status: CP
Start: 2019-07-06 — End: ?

## 2019-07-06 MED ORDER — FUROSEMIDE 40 MG TABLET
ORAL_TABLET | Freq: Every day | ORAL | 3 refills | 90 days | Status: CP
Start: 2019-07-06 — End: 2020-07-05

## 2019-07-06 MED ORDER — SERTRALINE 25 MG TABLET
ORAL_TABLET | Freq: Every day | ORAL | 3 refills | 90 days | Status: CP
Start: 2019-07-06 — End: 2019-10-04

## 2019-07-06 MED ORDER — NIACINAMIDE 500 MG TABLET
ORAL_TABLET | Freq: Two times a day (BID) | ORAL | 3 refills | 90 days | Status: CP
Start: 2019-07-06 — End: 2020-07-05

## 2019-07-06 MED ORDER — AZITHROMYCIN 250 MG TABLET
ORAL_TABLET | Freq: Every day | ORAL | 3 refills | 90 days | Status: CP
Start: 2019-07-06 — End: ?

## 2019-07-06 MED ORDER — SERTRALINE 50 MG TABLET
ORAL_TABLET | Freq: Every day | ORAL | 3 refills | 90.00000 days | Status: CP
Start: 2019-07-06 — End: 2019-07-06

## 2019-07-06 MED ORDER — OMEPRAZOLE 40 MG CAPSULE,DELAYED RELEASE
ORAL_CAPSULE | Freq: Two times a day (BID) | ORAL | 3 refills | 90 days | Status: CP
Start: 2019-07-06 — End: ?

## 2019-07-09 ENCOUNTER — Ambulatory Visit: Admit: 2019-07-09 | Discharge: 2019-07-10 | Payer: MEDICARE | Attending: Internal Medicine | Primary: Internal Medicine

## 2019-07-09 DIAGNOSIS — N2581 Secondary hyperparathyroidism of renal origin: Principal | ICD-10-CM

## 2019-07-09 DIAGNOSIS — N184 Chronic kidney disease, stage 4 (severe): Principal | ICD-10-CM

## 2019-07-09 DIAGNOSIS — M8580 Other specified disorders of bone density and structure, unspecified site: Principal | ICD-10-CM

## 2019-07-09 MED ORDER — FUROSEMIDE 20 MG TABLET
ORAL_TABLET | Freq: Every day | ORAL | 3 refills | 90 days | Status: CP
Start: 2019-07-09 — End: 2019-10-07

## 2019-08-13 DIAGNOSIS — Z942 Lung transplant status: Principal | ICD-10-CM

## 2019-08-13 MED ORDER — OMEPRAZOLE 40 MG CAPSULE,DELAYED RELEASE
ORAL_CAPSULE | Freq: Two times a day (BID) | ORAL | 3 refills | 90 days | Status: CP
Start: 2019-08-13 — End: 2019-11-11

## 2019-08-13 MED ORDER — AZITHROMYCIN 250 MG TABLET
ORAL_TABLET | Freq: Every day | ORAL | 3 refills | 90 days | Status: CP
Start: 2019-08-13 — End: 2019-11-11

## 2019-08-23 ENCOUNTER — Ambulatory Visit: Admit: 2019-08-23 | Discharge: 2019-08-24 | Payer: MEDICARE

## 2019-08-24 DIAGNOSIS — Z942 Lung transplant status: Principal | ICD-10-CM

## 2019-08-25 MED ORDER — HYDROCHLOROTHIAZIDE 25 MG TABLET
ORAL_TABLET | 0 refills | 0 days
Start: 2019-08-25 — End: ?

## 2019-08-30 DIAGNOSIS — Z942 Lung transplant status: Principal | ICD-10-CM

## 2019-08-31 ENCOUNTER — Ambulatory Visit: Admit: 2019-08-31 | Discharge: 2019-09-01 | Payer: MEDICARE | Attending: Family | Primary: Family

## 2019-08-31 ENCOUNTER — Ambulatory Visit: Admit: 2019-08-31 | Discharge: 2019-09-01 | Payer: MEDICARE

## 2019-08-31 ENCOUNTER — Ambulatory Visit: Admit: 2019-08-31 | Discharge: 2019-09-13 | Payer: MEDICARE

## 2019-08-31 ENCOUNTER — Institutional Professional Consult (permissible substitution): Admit: 2019-08-31 | Discharge: 2019-09-13 | Payer: MEDICARE

## 2019-08-31 DIAGNOSIS — M109 Gout, unspecified: Principal | ICD-10-CM

## 2019-08-31 DIAGNOSIS — R197 Diarrhea, unspecified: Principal | ICD-10-CM

## 2019-08-31 DIAGNOSIS — Z942 Lung transplant status: Principal | ICD-10-CM

## 2019-08-31 DIAGNOSIS — E785 Hyperlipidemia, unspecified: Principal | ICD-10-CM

## 2019-08-31 DIAGNOSIS — Z0000 Encounter for general adult medical examination without abnormal findings: Principal | ICD-10-CM

## 2019-08-31 MED ORDER — AZATHIOPRINE 50 MG TABLET
ORAL_TABLET | Freq: Every day | ORAL | 3 refills | 90.00000 days | Status: CP
Start: 2019-08-31 — End: 2020-08-30

## 2019-08-31 MED ORDER — TACROLIMUS 0.5 MG CAPSULE, IMMEDIATE-RELEASE
ORAL_CAPSULE | Freq: Two times a day (BID) | ORAL | 3 refills | 90 days | Status: CP
Start: 2019-08-31 — End: 2019-11-29

## 2019-09-03 DIAGNOSIS — Z942 Lung transplant status: Principal | ICD-10-CM

## 2019-09-03 MED ORDER — AZATHIOPRINE 50 MG TABLET
ORAL_TABLET | Freq: Every day | ORAL | 3 refills | 90 days | Status: CP
Start: 2019-09-03 — End: 2020-09-02

## 2019-10-04 ENCOUNTER — Ambulatory Visit: Admit: 2019-10-04 | Discharge: 2019-10-05 | Payer: MEDICARE

## 2019-10-04 DIAGNOSIS — Z942 Lung transplant status: Principal | ICD-10-CM

## 2019-10-04 MED ORDER — AZATHIOPRINE 50 MG TABLET
ORAL_TABLET | Freq: Every day | ORAL | 3 refills | 90 days | Status: CP
Start: 2019-10-04 — End: 2020-01-02

## 2019-10-10 ENCOUNTER — Ambulatory Visit: Admit: 2019-10-10 | Discharge: 2019-10-11 | Payer: MEDICARE

## 2019-10-11 ENCOUNTER — Institutional Professional Consult (permissible substitution): Admit: 2019-10-11 | Discharge: 2019-10-12 | Payer: MEDICARE

## 2019-10-11 DIAGNOSIS — N289 Disorder of kidney and ureter, unspecified: Principal | ICD-10-CM

## 2019-10-11 DIAGNOSIS — D508 Other iron deficiency anemias: Principal | ICD-10-CM

## 2019-10-18 DIAGNOSIS — D508 Other iron deficiency anemias: Principal | ICD-10-CM

## 2019-10-24 DIAGNOSIS — Z942 Lung transplant status: Principal | ICD-10-CM

## 2019-10-25 ENCOUNTER — Ambulatory Visit: Admit: 2019-10-25 | Discharge: 2019-10-26 | Payer: MEDICARE

## 2019-10-29 ENCOUNTER — Ambulatory Visit: Admit: 2019-10-29 | Payer: MEDICARE

## 2019-10-31 ENCOUNTER — Institutional Professional Consult (permissible substitution): Admit: 2019-10-31 | Discharge: 2019-11-01 | Payer: MEDICARE

## 2019-10-31 DIAGNOSIS — D508 Other iron deficiency anemias: Principal | ICD-10-CM

## 2019-10-31 DIAGNOSIS — N289 Disorder of kidney and ureter, unspecified: Principal | ICD-10-CM

## 2019-11-12 DIAGNOSIS — Z942 Lung transplant status: Principal | ICD-10-CM

## 2019-11-12 MED ORDER — AZITHROMYCIN 250 MG TABLET
ORAL_TABLET | Freq: Every day | ORAL | 3 refills | 90 days | Status: CP
Start: 2019-11-12 — End: 2020-02-10

## 2019-11-12 MED ORDER — HYDRALAZINE 25 MG TABLET
ORAL_TABLET | Freq: Two times a day (BID) | ORAL | 3 refills | 90 days | Status: CP
Start: 2019-11-12 — End: 2020-02-10

## 2019-11-12 MED ORDER — OMEPRAZOLE 40 MG CAPSULE,DELAYED RELEASE
ORAL_CAPSULE | Freq: Two times a day (BID) | ORAL | 3 refills | 90.00000 days | Status: CP
Start: 2019-11-12 — End: 2020-02-10

## 2019-11-12 MED ORDER — PREDNISONE 5 MG TABLET
ORAL_TABLET | Freq: Every day | ORAL | 3 refills | 90 days | Status: CP
Start: 2019-11-12 — End: 2020-02-10

## 2019-11-12 MED ORDER — SERTRALINE 25 MG TABLET
ORAL_TABLET | Freq: Every day | ORAL | 3 refills | 90 days | Status: CP
Start: 2019-11-12 — End: 2020-02-10

## 2019-11-12 MED ORDER — HYDROCHLOROTHIAZIDE 25 MG TABLET
ORAL_TABLET | Freq: Every day | ORAL | 3 refills | 90 days | Status: CP
Start: 2019-11-12 — End: 2020-02-10

## 2019-11-21 DIAGNOSIS — Z942 Lung transplant status: Principal | ICD-10-CM

## 2019-11-30 ENCOUNTER — Ambulatory Visit: Admit: 2019-11-30 | Discharge: 2019-12-01 | Payer: MEDICARE | Attending: Internal Medicine | Primary: Internal Medicine

## 2019-11-30 ENCOUNTER — Ambulatory Visit: Admit: 2019-11-30 | Discharge: 2019-12-01 | Payer: MEDICARE

## 2019-11-30 DIAGNOSIS — Z942 Lung transplant status: Principal | ICD-10-CM

## 2019-12-03 MED ORDER — METOPROLOL TARTRATE 25 MG TABLET
ORAL_TABLET | Freq: Two times a day (BID) | ORAL | 3 refills | 90.00000 days | Status: CP
Start: 2019-12-03 — End: ?

## 2019-12-24 ENCOUNTER — Ambulatory Visit: Admit: 2019-12-24 | Discharge: 2019-12-25 | Payer: MEDICARE

## 2019-12-24 DIAGNOSIS — D631 Anemia in chronic kidney disease: Secondary | ICD-10-CM

## 2019-12-24 DIAGNOSIS — I10 Essential (primary) hypertension: Principal | ICD-10-CM

## 2019-12-24 DIAGNOSIS — D508 Other iron deficiency anemias: Principal | ICD-10-CM

## 2019-12-24 DIAGNOSIS — E213 Hyperparathyroidism, unspecified: Principal | ICD-10-CM

## 2019-12-24 DIAGNOSIS — N184 Chronic kidney disease, stage 4 (severe): Principal | ICD-10-CM

## 2019-12-31 MED ORDER — OMEPRAZOLE 40 MG CAPSULE,DELAYED RELEASE
ORAL_CAPSULE | Freq: Two times a day (BID) | ORAL | 3 refills | 90.00000 days | Status: CP
Start: 2019-12-31 — End: 2020-03-30

## 2020-01-03 ENCOUNTER — Ambulatory Visit: Admit: 2020-01-03 | Discharge: 2020-01-04 | Payer: MEDICARE

## 2020-01-04 ENCOUNTER — Institutional Professional Consult (permissible substitution): Admit: 2020-01-04 | Discharge: 2020-01-05 | Payer: MEDICARE

## 2020-01-04 DIAGNOSIS — N183 Stage 3 chronic kidney disease, unspecified whether stage 3a or 3b CKD (CMS-HCC): Principal | ICD-10-CM

## 2020-01-31 ENCOUNTER — Ambulatory Visit: Admit: 2020-01-31 | Discharge: 2020-02-01 | Payer: MEDICARE

## 2020-01-31 DIAGNOSIS — Z942 Lung transplant status: Principal | ICD-10-CM

## 2020-02-04 ENCOUNTER — Institutional Professional Consult (permissible substitution): Admit: 2020-02-04 | Discharge: 2020-02-05 | Payer: MEDICARE

## 2020-02-04 DIAGNOSIS — D508 Other iron deficiency anemias: Principal | ICD-10-CM

## 2020-02-11 DIAGNOSIS — Z942 Lung transplant status: Principal | ICD-10-CM

## 2020-02-11 MED ORDER — PREDNISONE 5 MG TABLET
ORAL_TABLET | Freq: Every day | ORAL | 0 refills | 90.00000 days | Status: CP
Start: 2020-02-11 — End: 2020-05-11

## 2020-02-22 DIAGNOSIS — Z942 Lung transplant status: Principal | ICD-10-CM

## 2020-02-29 ENCOUNTER — Ambulatory Visit: Admit: 2020-02-29 | Discharge: 2020-03-01 | Payer: MEDICARE

## 2020-02-29 ENCOUNTER — Ambulatory Visit: Admit: 2020-02-29 | Discharge: 2020-03-01 | Payer: MEDICARE | Attending: Family | Primary: Family

## 2020-02-29 DIAGNOSIS — M109 Gout, unspecified: Principal | ICD-10-CM

## 2020-02-29 DIAGNOSIS — R634 Abnormal weight loss: Principal | ICD-10-CM

## 2020-02-29 DIAGNOSIS — G629 Polyneuropathy, unspecified: Principal | ICD-10-CM

## 2020-02-29 DIAGNOSIS — Z86010 Personal history of colonic polyps: Principal | ICD-10-CM

## 2020-02-29 DIAGNOSIS — N189 Chronic kidney disease, unspecified: Principal | ICD-10-CM

## 2020-02-29 DIAGNOSIS — M549 Dorsalgia, unspecified: Principal | ICD-10-CM

## 2020-02-29 DIAGNOSIS — D631 Anemia in chronic kidney disease: Principal | ICD-10-CM

## 2020-02-29 DIAGNOSIS — H539 Unspecified visual disturbance: Principal | ICD-10-CM

## 2020-02-29 DIAGNOSIS — K5900 Constipation, unspecified: Principal | ICD-10-CM

## 2020-02-29 DIAGNOSIS — Z942 Lung transplant status: Principal | ICD-10-CM

## 2020-02-29 DIAGNOSIS — K219 Gastro-esophageal reflux disease without esophagitis: Principal | ICD-10-CM

## 2020-02-29 DIAGNOSIS — Z7952 Long term (current) use of systemic steroids: Principal | ICD-10-CM

## 2020-02-29 DIAGNOSIS — R5383 Other fatigue: Principal | ICD-10-CM

## 2020-02-29 DIAGNOSIS — Z23 Encounter for immunization: Principal | ICD-10-CM

## 2020-02-29 DIAGNOSIS — I129 Hypertensive chronic kidney disease with stage 1 through stage 4 chronic kidney disease, or unspecified chronic kidney disease: Principal | ICD-10-CM

## 2020-02-29 DIAGNOSIS — K649 Unspecified hemorrhoids: Principal | ICD-10-CM

## 2020-02-29 DIAGNOSIS — R7989 Other specified abnormal findings of blood chemistry: Principal | ICD-10-CM

## 2020-02-29 DIAGNOSIS — I151 Hypertension secondary to other renal disorders: Principal | ICD-10-CM

## 2020-02-29 DIAGNOSIS — R5382 Chronic fatigue, unspecified: Principal | ICD-10-CM

## 2020-03-03 ENCOUNTER — Institutional Professional Consult (permissible substitution): Admit: 2020-03-03 | Discharge: 2020-03-04 | Payer: MEDICARE

## 2020-03-03 DIAGNOSIS — D508 Other iron deficiency anemias: Principal | ICD-10-CM

## 2020-03-28 ENCOUNTER — Ambulatory Visit: Admit: 2020-03-28 | Discharge: 2020-03-29 | Payer: MEDICARE

## 2020-03-28 DIAGNOSIS — R5382 Chronic fatigue, unspecified: Principal | ICD-10-CM

## 2020-03-28 DIAGNOSIS — I77819 Aortic ectasia, unspecified site: Principal | ICD-10-CM

## 2020-03-28 DIAGNOSIS — Z942 Lung transplant status: Principal | ICD-10-CM

## 2020-03-28 DIAGNOSIS — R7989 Other specified abnormal findings of blood chemistry: Principal | ICD-10-CM

## 2020-03-28 MED ORDER — TACROLIMUS 0.5 MG CAPSULE, IMMEDIATE-RELEASE
ORAL_CAPSULE | ORAL | 3 refills | 90 days | Status: CP
Start: 2020-03-28 — End: 2021-03-28

## 2020-03-29 DIAGNOSIS — U071 COVID-19: Principal | ICD-10-CM

## 2020-03-30 DIAGNOSIS — U071 COVID-19: Principal | ICD-10-CM

## 2020-04-01 ENCOUNTER — Telehealth: Admit: 2020-04-01 | Discharge: 2020-04-02 | Payer: MEDICARE | Attending: Family | Primary: Family

## 2020-04-01 DIAGNOSIS — G4734 Idiopathic sleep related nonobstructive alveolar hypoventilation: Principal | ICD-10-CM

## 2020-04-02 ENCOUNTER — Ambulatory Visit: Admit: 2020-04-02 | Discharge: 2020-04-03 | Payer: MEDICARE

## 2020-04-02 DIAGNOSIS — U071 COVID-19: Principal | ICD-10-CM

## 2020-04-02 DIAGNOSIS — Z23 Encounter for immunization: Principal | ICD-10-CM

## 2020-04-14 ENCOUNTER — Institutional Professional Consult (permissible substitution): Admit: 2020-04-14 | Discharge: 2020-04-15 | Payer: MEDICARE

## 2020-04-14 DIAGNOSIS — N183 Stage 3 chronic kidney disease, unspecified whether stage 3a or 3b CKD (CMS-HCC): Principal | ICD-10-CM

## 2020-04-15 MED ORDER — PRAVASTATIN 40 MG TABLET
ORAL_TABLET | 3 refills | 0 days | Status: CP
Start: 2020-04-15 — End: ?

## 2020-04-17 DIAGNOSIS — J329 Chronic sinusitis, unspecified: Principal | ICD-10-CM

## 2020-04-17 MED ORDER — DOXYCYCLINE HYCLATE 100 MG TABLET
ORAL_TABLET | Freq: Two times a day (BID) | ORAL | 0 refills | 14 days | Status: CP
Start: 2020-04-17 — End: 2020-05-01

## 2020-04-23 MED ORDER — PEG 3350-ELECTROLYTES 236 GRAM-22.74 GRAM-6.74 GRAM-5.86 GRAM SOLUTION
ORAL | 0 refills | 0 days | Status: CP
Start: 2020-04-23 — End: ?
  Filled 2020-04-24: qty 4000, 1d supply, fill #0

## 2020-05-08 ENCOUNTER — Ambulatory Visit: Admit: 2020-05-08 | Discharge: 2020-05-09 | Payer: MEDICARE

## 2020-05-09 ENCOUNTER — Institutional Professional Consult (permissible substitution): Admit: 2020-05-09 | Discharge: 2020-05-10 | Payer: MEDICARE

## 2020-05-09 DIAGNOSIS — N184 Chronic kidney disease, stage 4 (severe): Principal | ICD-10-CM

## 2020-05-09 DIAGNOSIS — D631 Anemia in chronic kidney disease: Principal | ICD-10-CM

## 2020-05-12 ENCOUNTER — Encounter
Admit: 2020-05-12 | Discharge: 2020-05-12 | Payer: MEDICARE | Attending: Certified Registered" | Primary: Certified Registered"

## 2020-05-12 ENCOUNTER — Ambulatory Visit: Admit: 2020-05-12 | Discharge: 2020-05-12 | Payer: MEDICARE

## 2020-05-13 MED ORDER — ALLOPURINOL 100 MG TABLET
ORAL_TABLET | 3 refills | 0 days
Start: 2020-05-13 — End: ?

## 2020-05-14 MED ORDER — ALLOPURINOL 100 MG TABLET
ORAL_TABLET | 3 refills | 0 days | Status: CP
Start: 2020-05-14 — End: ?

## 2020-05-22 DIAGNOSIS — Z942 Lung transplant status: Principal | ICD-10-CM

## 2020-05-22 MED ORDER — AZATHIOPRINE 50 MG TABLET
ORAL_TABLET | Freq: Every day | ORAL | 3 refills | 90 days | Status: CP
Start: 2020-05-22 — End: 2020-08-20

## 2020-05-23 ENCOUNTER — Ambulatory Visit: Admit: 2020-05-23 | Discharge: 2020-05-24 | Payer: MEDICARE

## 2020-05-23 DIAGNOSIS — Z942 Lung transplant status: Principal | ICD-10-CM

## 2020-05-23 DIAGNOSIS — K649 Unspecified hemorrhoids: Principal | ICD-10-CM

## 2020-05-26 ENCOUNTER — Institutional Professional Consult (permissible substitution): Admit: 2020-05-26 | Discharge: 2020-05-27 | Payer: MEDICARE

## 2020-05-26 DIAGNOSIS — D631 Anemia in chronic kidney disease: Principal | ICD-10-CM

## 2020-05-26 DIAGNOSIS — N184 Chronic kidney disease, stage 4 (severe): Principal | ICD-10-CM

## 2020-05-28 DIAGNOSIS — Z942 Lung transplant status: Principal | ICD-10-CM

## 2020-06-04 ENCOUNTER — Ambulatory Visit: Admit: 2020-06-04 | Discharge: 2020-06-05 | Payer: MEDICARE | Attending: Internal Medicine | Primary: Internal Medicine

## 2020-06-04 DIAGNOSIS — K649 Unspecified hemorrhoids: Principal | ICD-10-CM

## 2020-06-06 ENCOUNTER — Ambulatory Visit: Admit: 2020-06-06 | Discharge: 2020-06-07 | Payer: MEDICARE

## 2020-06-06 DIAGNOSIS — Z942 Lung transplant status: Principal | ICD-10-CM

## 2020-06-12 ENCOUNTER — Ambulatory Visit: Admit: 2020-06-12 | Discharge: 2020-06-14 | Payer: MEDICARE

## 2020-06-16 MED ORDER — HYDROCHLOROTHIAZIDE 25 MG TABLET
ORAL_TABLET | Freq: Every day | ORAL | 3 refills | 90 days | Status: CP
Start: 2020-06-16 — End: 2020-09-14

## 2020-06-23 ENCOUNTER — Ambulatory Visit: Admit: 2020-06-23 | Discharge: 2020-06-24 | Payer: MEDICARE

## 2020-06-23 DIAGNOSIS — D508 Other iron deficiency anemias: Principal | ICD-10-CM

## 2020-06-23 DIAGNOSIS — N184 Chronic kidney disease, stage 4 (severe): Principal | ICD-10-CM

## 2020-06-23 DIAGNOSIS — N183 Stage 3 chronic kidney disease, unspecified whether stage 3a or 3b CKD (CMS-HCC): Principal | ICD-10-CM

## 2020-06-23 DIAGNOSIS — I10 Essential (primary) hypertension: Principal | ICD-10-CM

## 2020-06-23 DIAGNOSIS — E213 Hyperparathyroidism, unspecified: Principal | ICD-10-CM

## 2020-06-23 DIAGNOSIS — D631 Anemia in chronic kidney disease: Principal | ICD-10-CM

## 2020-07-12 DIAGNOSIS — R52 Pain, unspecified: Principal | ICD-10-CM

## 2020-07-12 MED ORDER — GABAPENTIN 400 MG CAPSULE
ORAL_CAPSULE | 3 refills | 0 days
Start: 2020-07-12 — End: ?

## 2020-07-14 ENCOUNTER — Ambulatory Visit: Admit: 2020-07-14 | Discharge: 2020-07-15 | Payer: MEDICARE

## 2020-07-14 DIAGNOSIS — N184 Chronic kidney disease, stage 4 (severe): Principal | ICD-10-CM

## 2020-07-14 DIAGNOSIS — I10 Essential (primary) hypertension: Principal | ICD-10-CM

## 2020-07-14 DIAGNOSIS — E213 Hyperparathyroidism, unspecified: Principal | ICD-10-CM

## 2020-07-14 DIAGNOSIS — Z942 Lung transplant status: Principal | ICD-10-CM

## 2020-07-14 DIAGNOSIS — D508 Other iron deficiency anemias: Principal | ICD-10-CM

## 2020-07-14 DIAGNOSIS — D631 Anemia in chronic kidney disease: Principal | ICD-10-CM

## 2020-07-14 DIAGNOSIS — N183 Stage 3 chronic kidney disease, unspecified whether stage 3a or 3b CKD (CMS-HCC): Principal | ICD-10-CM

## 2020-07-14 MED ORDER — GABAPENTIN 400 MG CAPSULE
ORAL_CAPSULE | Freq: Every evening | ORAL | 3 refills | 90 days | Status: CP
Start: 2020-07-14 — End: 2021-07-14

## 2020-07-14 MED ORDER — HYDRALAZINE 25 MG TABLET
ORAL_TABLET | Freq: Two times a day (BID) | ORAL | 3 refills | 90 days | Status: CP
Start: 2020-07-14 — End: 2020-10-12

## 2020-07-14 MED ORDER — NIACINAMIDE 500 MG TABLET
ORAL_TABLET | Freq: Two times a day (BID) | ORAL | 3 refills | 90 days | Status: CP
Start: 2020-07-14 — End: 2021-07-14

## 2020-07-14 MED ORDER — PREDNISONE 5 MG TABLET
ORAL_TABLET | Freq: Every day | ORAL | 0 refills | 90.00000 days | Status: CP
Start: 2020-07-14 — End: 2020-10-12

## 2020-07-14 MED ORDER — SERTRALINE 25 MG TABLET
ORAL_TABLET | Freq: Every day | ORAL | 3 refills | 90 days | Status: CP
Start: 2020-07-14 — End: 2020-10-12

## 2020-07-14 MED ORDER — OMEPRAZOLE 40 MG CAPSULE,DELAYED RELEASE
ORAL_CAPSULE | Freq: Two times a day (BID) | ORAL | 3 refills | 90 days | Status: CP
Start: 2020-07-14 — End: 2021-07-14

## 2020-07-15 MED ORDER — NIACINAMIDE 500 MG TABLET
ORAL_TABLET | 3 refills | 0 days
Start: 2020-07-15 — End: ?

## 2020-07-15 MED ORDER — GABAPENTIN 400 MG CAPSULE
ORAL_CAPSULE | 3 refills | 0 days
Start: 2020-07-15 — End: ?

## 2020-07-18 ENCOUNTER — Institutional Professional Consult (permissible substitution): Admit: 2020-07-18 | Discharge: 2020-07-19 | Payer: MEDICARE

## 2020-07-18 DIAGNOSIS — D508 Other iron deficiency anemias: Principal | ICD-10-CM

## 2020-07-18 DIAGNOSIS — N184 Chronic kidney disease, stage 4 (severe): Principal | ICD-10-CM

## 2020-07-18 DIAGNOSIS — D631 Anemia in chronic kidney disease: Principal | ICD-10-CM

## 2020-07-22 MED ORDER — AZITHROMYCIN 250 MG TABLET
0 days
Start: 2020-07-22 — End: ?

## 2020-08-07 DIAGNOSIS — Z942 Lung transplant status: Principal | ICD-10-CM

## 2020-08-08 ENCOUNTER — Ambulatory Visit: Admit: 2020-08-08 | Discharge: 2020-08-09 | Payer: MEDICARE

## 2020-08-08 DIAGNOSIS — Z942 Lung transplant status: Principal | ICD-10-CM

## 2020-08-11 ENCOUNTER — Institutional Professional Consult (permissible substitution): Admit: 2020-08-11 | Discharge: 2020-08-12 | Payer: MEDICARE

## 2020-08-11 DIAGNOSIS — D508 Other iron deficiency anemias: Principal | ICD-10-CM

## 2020-08-23 MED ORDER — METOPROLOL TARTRATE 25 MG TABLET
ORAL_TABLET | 3 refills | 0 days
Start: 2020-08-23 — End: ?

## 2020-08-25 MED ORDER — METOPROLOL TARTRATE 25 MG TABLET
ORAL_TABLET | 3 refills | 0 days | Status: CP
Start: 2020-08-25 — End: ?

## 2020-08-26 ENCOUNTER — Ambulatory Visit: Admit: 2020-08-26 | Discharge: 2020-08-27 | Payer: MEDICARE | Attending: Internal Medicine | Primary: Internal Medicine

## 2020-08-26 DIAGNOSIS — Z942 Lung transplant status: Principal | ICD-10-CM

## 2020-09-01 ENCOUNTER — Ambulatory Visit: Admit: 2020-09-01 | Discharge: 2020-09-01 | Payer: MEDICARE

## 2020-09-01 ENCOUNTER — Ambulatory Visit: Admit: 2020-09-01 | Discharge: 2020-09-01 | Payer: MEDICARE | Attending: Family | Primary: Family

## 2020-09-01 DIAGNOSIS — Z87891 Personal history of nicotine dependence: Principal | ICD-10-CM

## 2020-09-01 DIAGNOSIS — Z942 Lung transplant status: Principal | ICD-10-CM

## 2020-09-01 DIAGNOSIS — Z136 Encounter for screening for cardiovascular disorders: Principal | ICD-10-CM

## 2020-09-01 DIAGNOSIS — Z0000 Encounter for general adult medical examination without abnormal findings: Principal | ICD-10-CM

## 2020-09-01 DIAGNOSIS — Z7952 Long term (current) use of systemic steroids: Principal | ICD-10-CM

## 2020-09-08 ENCOUNTER — Ambulatory Visit: Admit: 2020-09-08 | Discharge: 2020-09-09 | Payer: MEDICARE

## 2020-09-08 ENCOUNTER — Institutional Professional Consult (permissible substitution): Admit: 2020-09-08 | Discharge: 2020-09-09 | Payer: MEDICARE

## 2020-09-10 DIAGNOSIS — M8580 Other specified disorders of bone density and structure, unspecified site: Principal | ICD-10-CM

## 2020-09-17 DIAGNOSIS — Z01818 Encounter for other preprocedural examination: Principal | ICD-10-CM

## 2020-09-17 DIAGNOSIS — Z7682 Awaiting organ transplant status: Principal | ICD-10-CM

## 2020-09-17 DIAGNOSIS — N186 End stage renal disease: Principal | ICD-10-CM

## 2020-09-23 ENCOUNTER — Ambulatory Visit: Admit: 2020-09-23 | Discharge: 2020-09-24 | Payer: MEDICARE

## 2020-09-23 DIAGNOSIS — Z942 Lung transplant status: Principal | ICD-10-CM

## 2020-09-24 ENCOUNTER — Institutional Professional Consult (permissible substitution): Admit: 2020-09-24 | Discharge: 2020-09-25 | Payer: MEDICARE

## 2020-09-24 DIAGNOSIS — Z01818 Encounter for other preprocedural examination: Principal | ICD-10-CM

## 2020-09-24 DIAGNOSIS — Z7682 Awaiting organ transplant status: Principal | ICD-10-CM

## 2020-09-24 DIAGNOSIS — N184 Chronic kidney disease, stage 4 (severe): Principal | ICD-10-CM

## 2020-09-24 DIAGNOSIS — D508 Other iron deficiency anemias: Principal | ICD-10-CM

## 2020-09-24 DIAGNOSIS — N185 Chronic kidney disease, stage 5: Principal | ICD-10-CM

## 2020-10-03 ENCOUNTER — Ambulatory Visit: Admit: 2020-10-03 | Discharge: 2020-10-04 | Payer: MEDICARE

## 2020-10-03 DIAGNOSIS — N185 Chronic kidney disease, stage 5: Principal | ICD-10-CM

## 2020-10-13 ENCOUNTER — Ambulatory Visit: Admit: 2020-10-13 | Discharge: 2020-10-14 | Payer: MEDICARE

## 2020-10-13 DIAGNOSIS — Z7682 Awaiting organ transplant status: Principal | ICD-10-CM

## 2020-10-13 DIAGNOSIS — N185 Chronic kidney disease, stage 5: Principal | ICD-10-CM

## 2020-10-13 DIAGNOSIS — Z01818 Encounter for other preprocedural examination: Principal | ICD-10-CM

## 2020-10-13 DIAGNOSIS — D631 Anemia in chronic kidney disease: Principal | ICD-10-CM

## 2020-10-20 ENCOUNTER — Ambulatory Visit: Admit: 2020-10-20 | Discharge: 2020-10-21 | Payer: MEDICARE

## 2020-10-27 ENCOUNTER — Ambulatory Visit: Admit: 2020-10-27 | Discharge: 2020-10-28 | Payer: MEDICARE

## 2020-10-27 MED ORDER — SODIUM BICARBONATE 650 MG TABLET
ORAL_TABLET | Freq: Two times a day (BID) | ORAL | 3 refills | 90 days | Status: CP
Start: 2020-10-27 — End: 2021-10-27

## 2020-10-28 DIAGNOSIS — Z942 Lung transplant status: Principal | ICD-10-CM

## 2020-10-29 ENCOUNTER — Institutional Professional Consult (permissible substitution): Admit: 2020-10-29 | Discharge: 2020-10-30 | Payer: MEDICARE

## 2020-10-29 DIAGNOSIS — D508 Other iron deficiency anemias: Principal | ICD-10-CM

## 2020-10-30 DIAGNOSIS — N185 Chronic kidney disease, stage 5: Principal | ICD-10-CM

## 2020-10-30 DIAGNOSIS — D631 Anemia in chronic kidney disease: Principal | ICD-10-CM

## 2020-11-03 ENCOUNTER — Ambulatory Visit: Admit: 2020-11-03 | Discharge: 2020-11-04 | Payer: MEDICARE

## 2020-11-03 DIAGNOSIS — Z942 Lung transplant status: Principal | ICD-10-CM

## 2020-11-03 DIAGNOSIS — M8580 Other specified disorders of bone density and structure, unspecified site: Principal | ICD-10-CM

## 2020-11-03 DIAGNOSIS — N185 Chronic kidney disease, stage 5: Principal | ICD-10-CM

## 2020-11-03 DIAGNOSIS — E559 Vitamin D deficiency, unspecified: Principal | ICD-10-CM

## 2020-11-10 ENCOUNTER — Ambulatory Visit: Admit: 2020-11-10 | Discharge: 2020-11-11 | Payer: MEDICARE

## 2020-11-10 ENCOUNTER — Encounter: Admit: 2020-11-10 | Discharge: 2020-11-11 | Payer: MEDICARE | Attending: Surgery | Primary: Surgery

## 2020-11-10 DIAGNOSIS — Z01818 Encounter for other preprocedural examination: Principal | ICD-10-CM

## 2020-11-10 DIAGNOSIS — Z7682 Awaiting organ transplant status: Principal | ICD-10-CM

## 2020-11-10 DIAGNOSIS — D492 Neoplasm of unspecified behavior of bone, soft tissue, and skin: Principal | ICD-10-CM

## 2020-11-10 DIAGNOSIS — Z942 Lung transplant status: Principal | ICD-10-CM

## 2020-11-10 DIAGNOSIS — Z85828 Personal history of other malignant neoplasm of skin: Principal | ICD-10-CM

## 2020-11-12 DIAGNOSIS — Z942 Lung transplant status: Principal | ICD-10-CM

## 2020-11-12 DIAGNOSIS — D801 Nonfamilial hypogammaglobulinemia: Principal | ICD-10-CM

## 2020-11-14 ENCOUNTER — Institutional Professional Consult (permissible substitution): Admit: 2020-11-14 | Discharge: 2020-11-15 | Payer: MEDICARE

## 2020-11-14 DIAGNOSIS — D631 Anemia in chronic kidney disease: Principal | ICD-10-CM

## 2020-11-14 DIAGNOSIS — D099 Carcinoma in situ, unspecified: Principal | ICD-10-CM

## 2020-11-14 DIAGNOSIS — N185 Chronic kidney disease, stage 5: Principal | ICD-10-CM

## 2020-11-14 MED ORDER — FLUOROURACIL 5 % TOPICAL CREAM
2 refills | 0.00000 days | Status: CP
Start: 2020-11-14 — End: ?

## 2020-11-18 DIAGNOSIS — Z942 Lung transplant status: Principal | ICD-10-CM

## 2020-11-22 DIAGNOSIS — Z01818 Encounter for other preprocedural examination: Principal | ICD-10-CM

## 2020-11-22 DIAGNOSIS — Z7682 Awaiting organ transplant status: Principal | ICD-10-CM

## 2020-11-27 DIAGNOSIS — N186 End stage renal disease: Principal | ICD-10-CM

## 2020-11-27 DIAGNOSIS — Z992 Dependence on renal dialysis: Principal | ICD-10-CM

## 2020-11-28 ENCOUNTER — Ambulatory Visit: Admit: 2020-11-28 | Discharge: 2020-11-28 | Payer: MEDICARE

## 2020-11-28 ENCOUNTER — Encounter: Admit: 2020-11-28 | Discharge: 2020-11-28 | Payer: MEDICARE | Attending: Surgery | Primary: Surgery

## 2020-11-28 ENCOUNTER — Ambulatory Visit: Admit: 2020-11-28 | Discharge: 2020-11-28 | Payer: MEDICARE | Attending: Internal Medicine | Primary: Internal Medicine

## 2020-11-28 ENCOUNTER — Encounter: Admit: 2020-11-28 | Discharge: 2020-11-28 | Payer: MEDICARE | Attending: Internal Medicine | Primary: Internal Medicine

## 2020-11-28 DIAGNOSIS — Z942 Lung transplant status: Principal | ICD-10-CM

## 2020-12-01 ENCOUNTER — Ambulatory Visit: Admit: 2020-12-01 | Discharge: 2020-12-02 | Payer: MEDICARE

## 2020-12-01 DIAGNOSIS — Z7682 Awaiting organ transplant status: Principal | ICD-10-CM

## 2020-12-01 DIAGNOSIS — I120 Hypertensive chronic kidney disease with stage 5 chronic kidney disease or end stage renal disease: Principal | ICD-10-CM

## 2020-12-01 DIAGNOSIS — N185 Chronic kidney disease, stage 5: Principal | ICD-10-CM

## 2020-12-01 DIAGNOSIS — D631 Anemia in chronic kidney disease: Principal | ICD-10-CM

## 2020-12-03 ENCOUNTER — Ambulatory Visit
Admit: 2020-12-03 | Discharge: 2020-12-10 | Disposition: A | Discharge status: Home / Self Care | Payer: MEDICARE | Admitting: Nephrology

## 2020-12-03 ENCOUNTER — Encounter
Admit: 2020-12-03 | Discharge: 2020-12-10 | Disposition: A | Discharge status: Home / Self Care | Payer: MEDICARE | Admitting: Nephrology

## 2020-12-03 ENCOUNTER — Ambulatory Visit: Admit: 2020-12-03 | Payer: MEDICARE

## 2020-12-03 ENCOUNTER — Encounter
Admit: 2020-12-03 | Discharge: 2020-12-10 | Disposition: A | Discharge status: Home / Self Care | Payer: MEDICARE | Attending: Nephrology | Admitting: Nephrology

## 2020-12-03 ENCOUNTER — Encounter: Admit: 2020-12-03 | Payer: MEDICARE | Attending: Nephrology

## 2020-12-05 ENCOUNTER — Ambulatory Visit: Admit: 2020-12-05 | Payer: MEDICARE

## 2020-12-09 DIAGNOSIS — Z01818 Encounter for other preprocedural examination: Principal | ICD-10-CM

## 2020-12-09 DIAGNOSIS — Z7682 Awaiting organ transplant status: Principal | ICD-10-CM

## 2020-12-09 DIAGNOSIS — N186 End stage renal disease: Principal | ICD-10-CM

## 2020-12-10 DIAGNOSIS — D75839 Thrombocytosis: Principal | ICD-10-CM

## 2020-12-10 MED ORDER — PREDNISONE 5 MG TABLET
ORAL_TABLET | Freq: Every day | ORAL | 0 refills | 90 days | Status: CP
Start: 2020-12-10 — End: 2021-03-10

## 2020-12-10 MED ORDER — CALCIUM CARBONATE 200 MG CALCIUM (500 MG) CHEWABLE TABLET
ORAL_TABLET | Freq: Every evening | ORAL | 0 refills | 30 days | Status: CP
Start: 2020-12-10 — End: 2021-01-09

## 2020-12-10 MED ORDER — CHOLECALCIFEROL (VITAMIN D3) 25 MCG (1,000 UNIT) TABLET
ORAL_TABLET | Freq: Two times a day (BID) | ORAL | 11 refills | 30.00000 days | Status: CP
Start: 2020-12-10 — End: 2021-12-10

## 2020-12-10 MED ORDER — ALLOPURINOL 100 MG TABLET
ORAL_TABLET | Freq: Every day | ORAL | 0 refills | 30 days | Status: CP
Start: 2020-12-10 — End: 2021-01-09

## 2020-12-10 MED ORDER — HYDROCHLOROTHIAZIDE 25 MG TABLET
ORAL_TABLET | Freq: Every day | ORAL | 0 refills | 30.00000 days | Status: CP
Start: 2020-12-10 — End: 2020-12-10

## 2020-12-10 MED ORDER — HYDRALAZINE 25 MG TABLET
ORAL_TABLET | Freq: Two times a day (BID) | ORAL | 0 refills | 30.00000 days | Status: CP
Start: 2020-12-10 — End: 2020-12-10

## 2020-12-10 MED ORDER — TRAZODONE 50 MG TABLET
ORAL_TABLET | Freq: Every evening | ORAL | 3 refills | 90 days | Status: CN
Start: 2020-12-10 — End: 2021-01-09

## 2020-12-10 MED ORDER — TACROLIMUS 0.5 MG CAPSULE, IMMEDIATE-RELEASE
ORAL_CAPSULE | Freq: Two times a day (BID) | ORAL | 3 refills | 90.00000 days | Status: CP
Start: 2020-12-10 — End: 2021-12-10

## 2020-12-10 MED ORDER — DEXTROMETHORPHAN POLISTIREX ER 30 MG/5 ML ORAL SUSP EXT.RELEASE 12HR
Freq: Two times a day (BID) | ORAL | 0 refills | 9 days | Status: CP | PRN
Start: 2020-12-10 — End: ?

## 2020-12-10 MED ORDER — CALCIUM ACETATE(PHOSPHATE BINDERS) 667 MG CAPSULE
ORAL_CAPSULE | Freq: Three times a day (TID) | ORAL | 11 refills | 30.00000 days | Status: CN
Start: 2020-12-10 — End: 2021-12-10

## 2020-12-10 MED ORDER — TACROLIMUS 1 MG CAPSULE, IMMEDIATE-RELEASE
ORAL_CAPSULE | Freq: Two times a day (BID) | ORAL | 11 refills | 30 days | Status: CN
Start: 2020-12-10 — End: 2021-12-10

## 2020-12-11 DIAGNOSIS — D696 Thrombocytopenia, unspecified: Principal | ICD-10-CM

## 2020-12-11 MED ORDER — FUROSEMIDE 80 MG TABLET
ORAL_TABLET | Freq: Every day | ORAL | 0 refills | 30 days | Status: CP
Start: 2020-12-11 — End: 2021-01-10

## 2020-12-11 MED ORDER — EPOETIN ALFA-EPBX 3,000 UNIT/ML INJECTION SOLUTION
SUBCUTANEOUS | 0 refills | 28.00000 days | Status: CN
Start: 2020-12-11 — End: 2021-01-10

## 2020-12-15 ENCOUNTER — Ambulatory Visit: Admit: 2020-12-15 | Discharge: 2020-12-15 | Payer: MEDICARE | Attending: Adult Health | Primary: Adult Health

## 2020-12-15 ENCOUNTER — Other Ambulatory Visit: Admit: 2020-12-15 | Discharge: 2020-12-15 | Payer: MEDICARE

## 2020-12-15 ENCOUNTER — Ambulatory Visit: Admit: 2020-12-15 | Discharge: 2020-12-15 | Payer: MEDICARE

## 2020-12-15 DIAGNOSIS — D75839 Thrombocytosis: Principal | ICD-10-CM

## 2020-12-15 DIAGNOSIS — D696 Thrombocytopenia, unspecified: Principal | ICD-10-CM

## 2020-12-16 ENCOUNTER — Institutional Professional Consult (permissible substitution): Admit: 2020-12-16 | Discharge: 2020-12-17 | Payer: MEDICARE

## 2020-12-16 DIAGNOSIS — N185 Chronic kidney disease, stage 5: Principal | ICD-10-CM

## 2020-12-16 DIAGNOSIS — D631 Anemia in chronic kidney disease: Principal | ICD-10-CM

## 2020-12-16 DIAGNOSIS — D508 Other iron deficiency anemias: Principal | ICD-10-CM

## 2020-12-18 ENCOUNTER — Ambulatory Visit: Admit: 2020-12-18 | Discharge: 2020-12-19 | Payer: MEDICARE

## 2020-12-23 ENCOUNTER — Encounter: Admit: 2020-12-23 | Discharge: 2020-12-23 | Payer: MEDICARE

## 2020-12-26 DIAGNOSIS — Z7682 Awaiting organ transplant status: Principal | ICD-10-CM

## 2020-12-26 DIAGNOSIS — Z01818 Encounter for other preprocedural examination: Principal | ICD-10-CM

## 2020-12-26 DIAGNOSIS — N186 End stage renal disease: Principal | ICD-10-CM

## 2021-01-01 MED ORDER — FUROSEMIDE 80 MG TABLET
ORAL_TABLET | Freq: Every day | ORAL | 0 refills | 90 days | Status: CP
Start: 2021-01-01 — End: 2021-01-31

## 2021-01-02 DIAGNOSIS — D801 Nonfamilial hypogammaglobulinemia: Principal | ICD-10-CM

## 2021-01-02 DIAGNOSIS — Z942 Lung transplant status: Principal | ICD-10-CM

## 2021-01-07 MED ORDER — FUROSEMIDE 80 MG TABLET
ORAL_TABLET | 0 refills | 0 days | Status: CP
Start: 2021-01-07 — End: ?

## 2021-01-09 ENCOUNTER — Ambulatory Visit: Admit: 2021-01-09 | Discharge: 2021-01-10 | Payer: MEDICARE

## 2021-01-12 DIAGNOSIS — Z942 Lung transplant status: Principal | ICD-10-CM

## 2021-01-13 ENCOUNTER — Ambulatory Visit: Admit: 2021-01-13 | Discharge: 2021-01-14 | Payer: MEDICARE

## 2021-01-13 DIAGNOSIS — Z942 Lung transplant status: Principal | ICD-10-CM

## 2021-01-13 DIAGNOSIS — D801 Nonfamilial hypogammaglobulinemia: Principal | ICD-10-CM

## 2021-01-15 ENCOUNTER — Ambulatory Visit: Admit: 2021-01-15 | Discharge: 2021-01-16 | Payer: MEDICARE

## 2021-01-16 ENCOUNTER — Ambulatory Visit: Admit: 2021-01-16 | Discharge: 2021-01-17 | Payer: MEDICARE

## 2021-01-16 DIAGNOSIS — Z942 Lung transplant status: Principal | ICD-10-CM

## 2021-01-16 DIAGNOSIS — D801 Nonfamilial hypogammaglobulinemia: Principal | ICD-10-CM

## 2021-01-16 MED ORDER — AZITHROMYCIN 250 MG TABLET
ORAL_TABLET | 3 refills | 0 days | Status: CP
Start: 2021-01-16 — End: ?

## 2021-01-16 MED ORDER — AMLODIPINE 5 MG TABLET
ORAL_TABLET | Freq: Every day | ORAL | 3 refills | 90 days | Status: CP
Start: 2021-01-16 — End: 2022-01-16

## 2021-01-20 ENCOUNTER — Encounter: Admit: 2021-01-20 | Discharge: 2021-01-20 | Payer: MEDICARE

## 2021-01-20 MED ORDER — TRAZODONE 50 MG TABLET
ORAL_TABLET | Freq: Every evening | ORAL | 0 refills | 30 days | Status: CP
Start: 2021-01-20 — End: 2021-02-19

## 2021-01-20 MED ORDER — SERTRALINE 50 MG TABLET
ORAL_TABLET | Freq: Every day | ORAL | 3 refills | 90 days | Status: CP
Start: 2021-01-20 — End: 2022-01-20

## 2021-01-20 MED ORDER — SERTRALINE 25 MG TABLET
ORAL_TABLET | Freq: Every day | ORAL | 3 refills | 90.00000 days | Status: CP
Start: 2021-01-20 — End: 2022-01-20

## 2021-01-21 ENCOUNTER — Ambulatory Visit
Admit: 2021-01-21 | Discharge: 2021-01-27 | Disposition: A | Discharge status: Home Health | Payer: MEDICARE | Admitting: Internal Medicine

## 2021-01-21 ENCOUNTER — Encounter: Admit: 2021-01-21 | Discharge: 2021-01-27 | Payer: MEDICARE | Attending: Emergency Medicine

## 2021-01-21 ENCOUNTER — Encounter
Admit: 2021-01-21 | Discharge: 2021-01-27 | Disposition: A | Discharge status: Home Health | Payer: MEDICARE | Attending: Student in an Organized Health Care Education/Training Program | Admitting: Internal Medicine

## 2021-01-21 ENCOUNTER — Ambulatory Visit: Admit: 2021-01-21 | Discharge: 2021-01-27 | Payer: MEDICARE

## 2021-01-26 ENCOUNTER — Inpatient Hospital Stay: Payer: Medicare Other | Admitting: Oncology

## 2021-01-26 ENCOUNTER — Inpatient Hospital Stay: Payer: Medicare Other

## 2021-01-27 MED ORDER — TACROLIMUS 0.5 MG CAPSULE, IMMEDIATE-RELEASE
ORAL_CAPSULE | Freq: Two times a day (BID) | ORAL | 3 refills | 90.00000 days | Status: CP
Start: 2021-01-27 — End: 2022-01-27

## 2021-01-27 MED ORDER — MELATONIN 10 MG TABLET
ORAL_TABLET | Freq: Every evening | ORAL | 0 refills | 30.00000 days | Status: CP
Start: 2021-01-27 — End: ?

## 2021-01-27 MED ORDER — SERTRALINE 50 MG TABLET
ORAL_TABLET | Freq: Every day | ORAL | 0 refills | 30.00000 days | Status: CP
Start: 2021-01-27 — End: 2021-02-26

## 2021-01-28 DIAGNOSIS — K649 Unspecified hemorrhoids: Principal | ICD-10-CM

## 2021-01-28 DIAGNOSIS — N186 End stage renal disease: Principal | ICD-10-CM

## 2021-01-28 DIAGNOSIS — Z01818 Encounter for other preprocedural examination: Principal | ICD-10-CM

## 2021-01-28 DIAGNOSIS — Z7682 Awaiting organ transplant status: Principal | ICD-10-CM

## 2021-01-28 MED ORDER — SULFAMETHOXAZOLE 400 MG-TRIMETHOPRIM 80 MG TABLET
ORAL_TABLET | ORAL | 0 refills | 210.00000 days | Status: CP
Start: 2021-01-28 — End: ?

## 2021-01-28 MED ORDER — SERTRALINE 50 MG TABLET
ORAL_TABLET | Freq: Every day | ORAL | 0 refills | 30.00000 days | Status: CN
Start: 2021-01-28 — End: 2021-02-27

## 2021-01-30 DIAGNOSIS — M109 Gout, unspecified: Principal | ICD-10-CM

## 2021-01-30 DIAGNOSIS — Z942 Lung transplant status: Principal | ICD-10-CM

## 2021-01-30 MED ORDER — FEBUXOSTAT 40 MG TABLET
ORAL_TABLET | Freq: Every day | ORAL | 3 refills | 30 days | Status: CP
Start: 2021-01-30 — End: 2021-03-01

## 2021-01-30 MED ORDER — COLCHICINE 0.6 MG TABLET
ORAL_TABLET | Freq: Every day | ORAL | 11 refills | 30 days | Status: CP | PRN
Start: 2021-01-30 — End: 2022-01-30

## 2021-01-30 MED ORDER — ALLOPURINOL 100 MG TABLET
ORAL_TABLET | Freq: Every day | ORAL | 3 refills | 90 days | Status: CP
Start: 2021-01-30 — End: 2021-01-30

## 2021-02-02 ENCOUNTER — Other Ambulatory Visit
Admission: RE | Admit: 2021-02-02 | Discharge: 2021-02-02 | Disposition: A | Discharge status: Home / Self Care | Payer: Medicare Other | Source: Ambulatory Visit | Attending: Nephrology | Admitting: Nephrology

## 2021-02-02 LAB — BODY FLUID CELL COUNT WITH DIFFERENTIAL
Eos, Fluid: 0 %
Lymphs, Fluid: 0 %
Monocyte-Macrophage-Serous Fluid: 2 %
Neutrophil Count, Fluid: 98 %
Other Cells, Fluid: 0 %
Total Nucleated Cell Count, Fluid: 1008 cu mm

## 2021-02-03 LAB — PATHOLOGIST SMEAR REVIEW

## 2021-02-04 ENCOUNTER — Inpatient Hospital Stay: Payer: Medicare Other

## 2021-02-04 ENCOUNTER — Inpatient Hospital Stay: Payer: Medicare Other | Attending: Oncology | Admitting: Oncology

## 2021-02-04 ENCOUNTER — Other Ambulatory Visit: Payer: Self-pay

## 2021-02-04 ENCOUNTER — Encounter: Payer: Self-pay | Admitting: Oncology

## 2021-02-04 VITALS — BP 130/83 | HR 98 | Temp 97.9°F | Wt 210.0 lb

## 2021-02-04 DIAGNOSIS — N186 End stage renal disease: Secondary | ICD-10-CM | POA: Diagnosis not present

## 2021-02-04 DIAGNOSIS — E538 Deficiency of other specified B group vitamins: Secondary | ICD-10-CM | POA: Diagnosis not present

## 2021-02-04 DIAGNOSIS — Z942 Lung transplant status: Secondary | ICD-10-CM

## 2021-02-04 DIAGNOSIS — Z803 Family history of malignant neoplasm of breast: Secondary | ICD-10-CM

## 2021-02-04 DIAGNOSIS — Z87891 Personal history of nicotine dependence: Secondary | ICD-10-CM | POA: Diagnosis not present

## 2021-02-04 DIAGNOSIS — Z992 Dependence on renal dialysis: Secondary | ICD-10-CM | POA: Diagnosis not present

## 2021-02-04 DIAGNOSIS — D696 Thrombocytopenia, unspecified: Secondary | ICD-10-CM

## 2021-02-04 DIAGNOSIS — D631 Anemia in chronic kidney disease: Secondary | ICD-10-CM

## 2021-02-04 LAB — CBC WITH DIFFERENTIAL/PLATELET
Abs Immature Granulocytes: 0.04 10*3/uL (ref 0.00–0.07)
Basophils Absolute: 0 10*3/uL (ref 0.0–0.1)
Basophils Relative: 0 %
Eosinophils Absolute: 0 10*3/uL (ref 0.0–0.5)
Eosinophils Relative: 0 %
HCT: 21.8 % — ABNORMAL LOW (ref 39.0–52.0)
Hemoglobin: 7.5 g/dL — ABNORMAL LOW (ref 13.0–17.0)
Immature Granulocytes: 1 %
Lymphocytes Relative: 21 %
Lymphs Abs: 1.2 10*3/uL (ref 0.7–4.0)
MCH: 36.4 pg — ABNORMAL HIGH (ref 26.0–34.0)
MCHC: 34.4 g/dL (ref 30.0–36.0)
MCV: 105.8 fL — ABNORMAL HIGH (ref 80.0–100.0)
Monocytes Absolute: 0.4 10*3/uL (ref 0.1–1.0)
Monocytes Relative: 7 %
Neutro Abs: 4 10*3/uL (ref 1.7–7.7)
Neutrophils Relative %: 71 %
Platelets: 37 10*3/uL — ABNORMAL LOW (ref 150–400)
RBC: 2.06 MIL/uL — ABNORMAL LOW (ref 4.22–5.81)
RDW: 23 % — ABNORMAL HIGH (ref 11.5–15.5)
WBC: 5.6 10*3/uL (ref 4.0–10.5)
nRBC: 0 % (ref 0.0–0.2)

## 2021-02-04 LAB — SAMPLE TO BLOOD BANK

## 2021-02-04 LAB — VITAMIN B12: Vitamin B-12: 540 pg/mL (ref 180–914)

## 2021-02-04 LAB — IMMATURE PLATELET FRACTION: Immature Platelet Fraction: 1.8 % (ref 1.2–8.6)

## 2021-02-04 LAB — TECHNOLOGIST SMEAR REVIEW: Plt Morphology: NORMAL

## 2021-02-05 ENCOUNTER — Encounter: Payer: Self-pay | Admitting: Oncology

## 2021-02-05 ENCOUNTER — Other Ambulatory Visit
Admission: RE | Admit: 2021-02-05 | Discharge: 2021-02-05 | Disposition: A | Discharge status: Home / Self Care | Payer: Medicare Other | Source: Ambulatory Visit | Attending: Nephrology | Admitting: Nephrology

## 2021-02-05 DIAGNOSIS — K650 Generalized (acute) peritonitis: Secondary | ICD-10-CM | POA: Diagnosis present

## 2021-02-05 DIAGNOSIS — D696 Thrombocytopenia, unspecified: Secondary | ICD-10-CM

## 2021-02-05 HISTORY — DX: Thrombocytopenia, unspecified: D69.6

## 2021-02-05 LAB — FOLATE: Folate: 5.6 ng/mL — ABNORMAL LOW (ref >5.9)

## 2021-02-05 LAB — VANCOMYCIN, TROUGH: Vancomycin Tr: 9 ug/mL — ABNORMAL LOW (ref 15–20)

## 2021-02-05 MED ORDER — FOLIC ACID 400 MCG PO TABS: 400.0000 ug | ORAL_TABLET | Freq: Every day | ORAL | 0 refills | Status: AC

## 2021-02-05 NOTE — Progress Notes (Signed)
Hematology/Oncology Consult note [phone] [phone]      Patient Care Team: Franciso Bend, MD as PCP - General (Nephrology)  REFERRING PROVIDER: Franciso Bend, MD  CHIEF COMPLAINTS/REASON FOR VISIT:  Evaluation of thrombocytopenia  HISTORY OF PRESENTING ILLNESS:   Duane Petty is a  65 y.o.  male with PMH listed below was seen in consultation at the request of  Voora, Raven A, MD  for evaluation of thrombocytopenia.  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  Patient had work-up of thrombocytopenia including  bone marrow biopsy on 12/04/2020 which resulted with normocellular bone marrow and no significant morphological changes.  Thrombocytopenia was attributed to Imuran, bleeding was attributed to thrombocytopenia and uremia induced platelet dysfunction.  Patient has received desmopressin and received platelet transfusions with appropriate response.  Patient was admitted again in November 2022 to [facility] due to symptomatic hypotension, bright red per rectum on 01/24/2021.  Symptomatic anemia, hemoglobin was found to be below 7 and received 1 unit of PRBC transfusion.  Patient had embolization of hemorrhoid.  He was found to have a platelet count of 11,000 at admission.  Received platelet transfusion and Nplate 3 mcg/kg x 1 on 01/22/2021.  There is plan for patient to continue Nplate outpatient and the patient would like to establish care locally for transfusions and Nplate injections.  Patient follows up with nephrology Dr. Smith Mince at [facility]. Today patient was accompanied by his wife.  Denies any rectal bleeding.  No nausea vomiting, fever or chills.   Review of Systems  Constitutional:  Positive for fatigue. Negative for chills and fever.  HENT:   Negative for hearing loss and voice change.   Eyes:   Negative for eye problems and icterus.  Respiratory:  Negative for chest tightness, cough and shortness of breath.   Cardiovascular:  Negative for chest pain and leg swelling.  Gastrointestinal:  Negative for abdominal distention, abdominal pain, nausea and vomiting.       Recent history of lower GI bleeding.  Endocrine: Negative for hot flashes.  Genitourinary:  Negative for difficulty urinating, dysuria and frequency.   Musculoskeletal:  Negative for arthralgias.  Skin:  Negative for itching and rash.  Neurological:  Negative for light-headedness and numbness.  Hematological:  Negative for adenopathy. Bruises/bleeds easily.  Psychiatric/Behavioral:  Negative for confusion.    MEDICAL HISTORY:  Past Medical History:  Diagnosis Date   Allergy    Anemia    Anxiety    Blood transfusion without reported diagnosis    Cataract    Chronic kidney disease    COPD (chronic obstructive pulmonary disease) ([place])    Depression    GERD (gastroesophageal reflux disease)    Hypertension    Lung transplant recipient [facility])    Thrombocytopenia ([place]) 02/05/2021    SURGICAL HISTORY: Past Surgical History:  Procedure Laterality Date   LUNG TRANSPLANT Bilateral    pernial tube     vitreo-retinal surgery      SOCIAL HISTORY: Social History   Socioeconomic History   Marital status: Divorced    Spouse name: Not on file   Number of children: Not on file   Years of education: Not on file   Highest education level: Not on file  Occupational History   Not on file  Tobacco Use   Smoking status: Former    Types: Cigarettes  Smokeless tobacco: Not on file  Vaping Use   Vaping Use: Every day  Substance and Sexual Activity   Alcohol use: Not Currently   Drug use: Never   Sexual activity: Yes    Birth control/protection: Implant  Other Topics Concern   Not on file  Social History Narrative   Not on file   Social Determinants of Health   Financial Resource Strain: Not on file  Food  Insecurity: Not on file  Transportation Needs: Not on file  Physical Activity: Not on file  Stress: Not on file  Social Connections: Not on file  Intimate Partner Violence: Not on file    FAMILY HISTORY: Family History  Problem Relation Age of Onset   Breast cancer Mother    Aneurysm Father     ALLERGIES:  has no allergies on file.  MEDICATIONS:  Current Outpatient Medications  Medication Sig Dispense Refill   amLODipine (NORVASC) 5 MG tablet Take 5 mg by mouth daily. 1 Tablet(s) By Mouth Daily     azithromycin (ZITHROMAX) 250 MG tablet Take 250 mg by mouth daily. TAKE ONE TABLET EVERY DAY     dextromethorphan (DELSYM) 30 MG/5ML liquid Take by mouth. Take 5 mL (30 mg total) by mouth every twelve (12) hours as needed for cough.     folic acid (V-R FOLIC ACID) 294 MCG tablet Take 1 tablet (400 mcg total) by mouth daily. 90 tablet 0   gentamicin cream (GARAMYCIN) 0.1 % Apply topically daily. SMARTSIG:Topical Daily     Melatonin 10 MG TABS Take by mouth. Take 1 tablet (10 mg total) by mouth every evening.     niacinamide 500 MG tablet Take 500 mg by mouth 2 (two) times daily. 1 Tablet(s) By Mouth Twice Daily     omeprazole (PRILOSEC) 20 MG capsule omeprazole 20 mg capsule,delayed release     pravastatin (PRAVACHOL) 40 MG tablet pravastatin 40 mg tablet     predniSONE (DELTASONE) 5 MG tablet Take 1 tablet by mouth daily. Take 1 tablet (5 mg total) by mouth daily     sertraline (ZOLOFT) 50 MG tablet Take 1 tablet by mouth daily. Take 1 tablet (50 mg total) by mouth daily.     tacrolimus (PROGRAF) 0.5 MG capsule Take by mouth. (0.5 mg total) by mouth two (2) times a day.     No current facility-administered medications for this visit.     PHYSICAL EXAMINATION:  Vitals:   02/04/21 1521  BP: 130/83  Pulse: 98  Temp: 97.9 F (36.6 C)   Filed Weights   02/04/21 1521  Weight: 210 lb (95.3 kg)    Physical Exam Constitutional:      General: He is not in acute distress.     Comments: Patient sits in the wheelchair.   HENT:     Head: Normocephalic and atraumatic.  Eyes:     General: No scleral icterus. Cardiovascular:     Rate and Rhythm: Normal rate and regular rhythm.     Heart sounds: Normal heart sounds.  Pulmonary:     Effort: Pulmonary effort is normal. No respiratory distress.     Breath sounds: No wheezing.  Abdominal:     General: Bowel sounds are normal. There is no distension.     Palpations: Abdomen is soft.  Musculoskeletal:        General: Normal range of motion.     Cervical back: Normal range of motion and neck supple.  Skin:    General: Skin is warm and  dry.  Neurological:     Mental Status: He is alert and oriented to person, place, and time. Mental status is at baseline.     Cranial Nerves: No cranial nerve deficit.     Coordination: Coordination normal.  Psychiatric:        Mood and Affect: Mood normal.    LABORATORY DATA:  I have reviewed the data as listed Lab Results  Component Value Date   WBC 5.6 02/04/2021   HGB 7.5 (L) 02/04/2021   HCT 21.8 (L) 02/04/2021   MCV 105.8 (H) 02/04/2021   PLT 37 (L) 02/04/2021   No results for input(s): NA, K, CL, CO2, GLUCOSE, BUN, CREATININE, CALCIUM, GFRNONAA, GFRAA, PROT, ALBUMIN, AST, ALT, ALKPHOS, BILITOT, BILIDIR, IBILI in the last 8760 hours. Iron/TIBC/Ferritin/ %Sat No results found for: IRON, TIBC, FERRITIN, IRONPCTSAT    RADIOGRAPHIC STUDIES: I have personally reviewed the radiological images as listed and agreed with the findings in the report. No results found.    ASSESSMENT & PLAN:  1. Thrombocytopenia ([place])   2. 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  Recurrent rectal bleeding due to hemorrhoids status post embolization. Check CBC, immature platelet fraction, vitamin B12, folate. Will check insurance coverage for Nplate.  Patient has received 1 dose at [facility].  We will start at a dose of 44mg/kg weekly with titration of platelet counts.  [facility] REli Phillips   #Folate deficiency, mild.  Recommend patient to start folic acid 4417MCG daily. #Anemia in CKD, hemoglobin 7.5, MCV 105.8- [facility] Will transfuse PRBC to keep hemoglobin above 7 or if symptomatic.  #History of lung transplant, follow-up with [facility].  Imuran, tacrolimus #[facility]ulinemia [facility]  Orders Placed This Encounter  Procedures   CBC with Differential/Platelet    Standing Status:   Future    Number of Occurrences:   1    Standing Expiration Date:   02/04/2022   Technologist smear review    Standing Status:   Future    Number of Occurrences:   1    Standing Expiration Date:   02/04/2022   Immature Platelet Fraction    Standing Status:   Future    Number of Occurrences:   1    Standing Expiration Date:   02/04/2022   Vitamin B12    Standing Status:   Future    Number of Occurrences:   1    Standing Expiration Date:   02/04/2022   Folate    Standing Status:   Future    Number of Occurrences:   1    Standing Expiration Date:   02/04/2022   Hold Tube- Blood Bank    Standing Status:   Future    Number of Occurrences:   1    Standing Expiration Date:   02/04/2022    All questions were answered. The patient knows to call the clinic with any problems questions or concerns.  cc VFranciso Bend MD    Return of visit: weekly cbc and N plate.    ZEarlie Server MD, PhD  02/05/2021

## 2021-02-06 ENCOUNTER — Telehealth: Payer: Self-pay | Admitting: Oncology

## 2021-02-06 ENCOUNTER — Encounter: Payer: Self-pay | Admitting: Oncology

## 2021-02-06 ENCOUNTER — Telehealth: Payer: Self-pay

## 2021-02-06 ENCOUNTER — Other Ambulatory Visit: Payer: Self-pay | Admitting: Oncology

## 2021-02-06 DIAGNOSIS — E538 Deficiency of other specified B group vitamins: Secondary | ICD-10-CM | POA: Insufficient documentation

## 2021-02-06 DIAGNOSIS — D696 Thrombocytopenia, unspecified: Secondary | ICD-10-CM

## 2021-02-06 DIAGNOSIS — D649 Anemia, unspecified: Secondary | ICD-10-CM

## 2021-02-06 DIAGNOSIS — N186 End stage renal disease: Secondary | ICD-10-CM | POA: Insufficient documentation

## 2021-02-06 LAB — BODY FLUID CULTURE W GRAM STAIN

## 2021-02-06 NOTE — Telephone Encounter (Signed)
Patient informed of resutls and follow up plan.    Please call pt's wife, Janace Hoard, before scheduling ([phone])   Lab/ Nplate injection weekly x3 (first dose early next week). Please add poss platelet transfusion to appt next week.    Lab/MD/ Nplate inj in 4 weeks (cbc, hold tube)

## 2021-02-06 NOTE — Addendum Note (Signed)
Addended by: Earlie Server on: 02/06/2021 08:33 AM   Modules accepted: Orders

## 2021-02-06 NOTE — Telephone Encounter (Deleted)
Patient informed of resutls and follow up plan.   Please call pt's wife, Duane Petty, before scheduling [phone])  Lab/ Nplate injection weekly x3 (first dose early next week) Lab/MD/ Nplate inj in 4 weeks (cbc, hold tube)

## 2021-02-06 NOTE — Telephone Encounter (Signed)
-----   Message from Earlie Server, MD sent at 02/05/2021  3:00 PM EST ----- Please let patient/ wife know that folate level is low, take folic acid, rx will be sent .  Also please schedule him to get weekly cbc, hold tube + N plate injection x 3 [ first dose early next week]  Lab cbc hold tube in 4 weeks, MD + N plate injection

## 2021-02-06 NOTE — Telephone Encounter (Signed)
Wife would like to change pt's appt to 11-22. Please give her a call at [phone]

## 2021-02-09 ENCOUNTER — Other Ambulatory Visit
Admission: RE | Admit: 2021-02-09 | Discharge: 2021-02-09 | Disposition: A | Discharge status: Home / Self Care | Payer: Medicare Other | Source: Other Acute Inpatient Hospital | Attending: Nephrology | Admitting: Nephrology

## 2021-02-09 ENCOUNTER — Other Ambulatory Visit: Payer: Medicare Other

## 2021-02-09 ENCOUNTER — Ambulatory Visit: Payer: Medicare Other

## 2021-02-09 ENCOUNTER — Encounter: Payer: Self-pay | Admitting: Oncology

## 2021-02-09 LAB — VANCOMYCIN, TROUGH: Vancomycin Tr: 13 ug/mL — ABNORMAL LOW (ref 15–20)

## 2021-02-10 ENCOUNTER — Other Ambulatory Visit: Payer: Self-pay

## 2021-02-10 ENCOUNTER — Inpatient Hospital Stay: Payer: Medicare Other

## 2021-02-10 VITALS — BP 138/79 | HR 95 | Temp 96.1°F

## 2021-02-10 DIAGNOSIS — D696 Thrombocytopenia, unspecified: Secondary | ICD-10-CM

## 2021-02-10 DIAGNOSIS — D649 Anemia, unspecified: Secondary | ICD-10-CM

## 2021-02-10 LAB — CBC WITH DIFFERENTIAL/PLATELET
Abs Immature Granulocytes: 0.05 10*3/uL (ref 0.00–0.07)
Basophils Absolute: 0 10*3/uL (ref 0.0–0.1)
Basophils Relative: 0 %
Eosinophils Absolute: 0 10*3/uL (ref 0.0–0.5)
Eosinophils Relative: 0 %
HCT: 21.3 % — ABNORMAL LOW (ref 39.0–52.0)
Hemoglobin: 7.3 g/dL — ABNORMAL LOW (ref 13.0–17.0)
Immature Granulocytes: 1 %
Lymphocytes Relative: 32 %
Lymphs Abs: 2.4 10*3/uL (ref 0.7–4.0)
MCH: 36.5 pg — ABNORMAL HIGH (ref 26.0–34.0)
MCHC: 34.3 g/dL (ref 30.0–36.0)
MCV: 106.5 fL — ABNORMAL HIGH (ref 80.0–100.0)
Monocytes Absolute: 0.7 10*3/uL (ref 0.1–1.0)
Monocytes Relative: 9 %
Neutro Abs: 4.3 10*3/uL (ref 1.7–7.7)
Neutrophils Relative %: 58 %
Platelets: 42 10*3/uL — ABNORMAL LOW (ref 150–400)
RBC: 2 MIL/uL — ABNORMAL LOW (ref 4.22–5.81)
RDW: 23.9 % — ABNORMAL HIGH (ref 11.5–15.5)
WBC: 7.4 10*3/uL (ref 4.0–10.5)
nRBC: 1.1 % — ABNORMAL HIGH (ref 0.0–0.2)

## 2021-02-10 LAB — TYPE AND SCREEN
ABO/RH(D): B POS
Antibody Screen: NEGATIVE

## 2021-02-10 MED ORDER — ROMIPLOSTIM 250 MCG ~~LOC~~ SOLR
2.0000 ug/kg | Freq: Once | SUBCUTANEOUS | Status: AC
  Administered 2021-02-10: 190 ug via SUBCUTANEOUS
  Filled 2021-02-10: qty 0.38

## 2021-02-10 NOTE — Patient Instructions (Signed)
Romiplostim injection ?What is this medication? ?ROMIPLOSTIM (roe mi PLOE stim) helps your body make more platelets. This medicine is used to treat low platelets caused by chronic idiopathic thrombocytopenic purpura (ITP) or a bone marrow syndrome caused by radiation sickness. ?This medicine may be used for other purposes; ask your health care provider or pharmacist if you have questions. ?COMMON BRAND NAME(S): Nplate ?What should I tell my care team before I take this medication? ?They need to know if you have any of these conditions: ?blood clots ?myelodysplastic syndrome ?an unusual or allergic reaction to romiplostim, mannitol, other medicines, foods, dyes, or preservatives ?pregnant or trying to get pregnant ?breast-feeding ?How should I use this medication? ?This medicine is injected under the skin. It is given by a health care provider in a hospital or clinic setting. ?A special MedGuide will be given to you before each treatment. Be sure to read this information carefully each time. ?Talk to your health care provider about the use of this medicine in children. While it may be prescribed for children as young as newborns for selected conditions, precautions do apply. ?Overdosage: If you think you have taken too much of this medicine contact a poison control center or emergency room at once. ?NOTE: This medicine is only for you. Do not share this medicine with others. ?What if I miss a dose? ?Keep appointments for follow-up doses. It is important not to miss your dose. Call your health care provider if you are unable to keep an appointment. ?What may interact with this medication? ?Interactions are not expected. ?This list may not describe all possible interactions. Give your health care provider a list of all the medicines, herbs, non-prescription drugs, or dietary supplements you use. Also tell them if you smoke, drink alcohol, or use illegal drugs. Some items may interact with your medicine. ?What should I  watch for while using this medication? ?Visit your health care provider for regular checks on your progress. You may need blood work done while you are taking this medicine. Your condition will be monitored carefully while you are receiving this medicine. It is important not to miss any appointments. ?What side effects may I notice from receiving this medication? ?Side effects that you should report to your doctor or health care professional as soon as possible: ?allergic reactions (skin rash, itching or hives; swelling of the face, lips, or tongue) ?bleeding (bloody or black, tarry stools; red or dark brown urine; spitting up blood or brown material that looks like coffee grounds; red spots on the skin; unusual bruising or bleeding from the eyes, gums, or nose) ?blood clot (chest pain; shortness of breath; pain, swelling, or warmth in the leg) ?stroke (changes in vision; confusion; trouble speaking or understanding; severe headaches; sudden numbness or weakness of the face, arm or leg; trouble walking; dizziness; loss of balance or coordination) ?Side effects that usually do not require medical attention (report to your doctor or health care professional if they continue or are bothersome): ?diarrhea ?dizziness ?headache ?joint pain ?muscle pain ?stomach pain ?trouble sleeping ?This list may not describe all possible side effects. Call your doctor for medical advice about side effects. You may report side effects to FDA at 1-800-FDA-1088. ?Where should I keep my medication? ?This medicine is given in a hospital or clinic. It will not be stored at home. ?NOTE: This sheet is a summary. It may not cover all possible information. If you have questions about this medicine, talk to your doctor, pharmacist, or health care provider. ??   2022 Elsevier/Gold Standard (2020-11-25 00:00:00) ? ?

## 2021-02-12 ENCOUNTER — Other Ambulatory Visit
Admission: RE | Admit: 2021-02-12 | Discharge: 2021-02-12 | Disposition: A | Discharge status: Home / Self Care | Payer: Medicare Other | Source: Ambulatory Visit | Attending: Nephrology | Admitting: Nephrology

## 2021-02-12 DIAGNOSIS — K650 Generalized (acute) peritonitis: Secondary | ICD-10-CM | POA: Insufficient documentation

## 2021-02-12 LAB — VANCOMYCIN, TROUGH: Vancomycin Tr: 26 ug/mL (ref 15–20)

## 2021-02-16 ENCOUNTER — Other Ambulatory Visit: Payer: Self-pay | Admitting: Oncology

## 2021-02-16 ENCOUNTER — Other Ambulatory Visit: Payer: Self-pay

## 2021-02-16 ENCOUNTER — Inpatient Hospital Stay: Payer: Medicare Other

## 2021-02-16 VITALS — Wt 197.0 lb

## 2021-02-16 DIAGNOSIS — D696 Thrombocytopenia, unspecified: Secondary | ICD-10-CM

## 2021-02-16 DIAGNOSIS — D631 Anemia in chronic kidney disease: Secondary | ICD-10-CM

## 2021-02-16 LAB — CBC WITH DIFFERENTIAL/PLATELET
Abs Immature Granulocytes: 0.04 10*3/uL (ref 0.00–0.07)
Basophils Absolute: 0 10*3/uL (ref 0.0–0.1)
Basophils Relative: 0 %
Eosinophils Absolute: 0 10*3/uL (ref 0.0–0.5)
Eosinophils Relative: 0 %
HCT: 21.2 % — ABNORMAL LOW (ref 39.0–52.0)
Hemoglobin: 7.5 g/dL — ABNORMAL LOW (ref 13.0–17.0)
Immature Granulocytes: 1 %
Lymphocytes Relative: 21 %
Lymphs Abs: 1.3 10*3/uL (ref 0.7–4.0)
MCH: 38.1 pg — ABNORMAL HIGH (ref 26.0–34.0)
MCHC: 35.4 g/dL (ref 30.0–36.0)
MCV: 107.6 fL — ABNORMAL HIGH (ref 80.0–100.0)
Monocytes Absolute: 0.3 10*3/uL (ref 0.1–1.0)
Monocytes Relative: 5 %
Neutro Abs: 4.5 10*3/uL (ref 1.7–7.7)
Neutrophils Relative %: 73 %
Platelets: 39 10*3/uL — ABNORMAL LOW (ref 150–400)
RBC: 1.97 MIL/uL — ABNORMAL LOW (ref 4.22–5.81)
RDW: 26 % — ABNORMAL HIGH (ref 11.5–15.5)
WBC: 6.1 10*3/uL (ref 4.0–10.5)
nRBC: 1.5 % — ABNORMAL HIGH (ref 0.0–0.2)

## 2021-02-16 LAB — SAMPLE TO BLOOD BANK

## 2021-02-16 MED ORDER — ROMIPLOSTIM INJECTION 500 MCG
260.0000 ug | Freq: Once | SUBCUTANEOUS | Status: AC
  Administered 2021-02-16: 17:00:00 260 ug via SUBCUTANEOUS
  Filled 2021-02-16: qty 0.52

## 2021-02-17 DIAGNOSIS — Z942 Lung transplant status: Principal | ICD-10-CM

## 2021-02-19 ENCOUNTER — Encounter: Admit: 2021-02-19 | Discharge: 2021-02-19 | Payer: MEDICARE

## 2021-02-20 DIAGNOSIS — R059 Cough, unspecified type: Principal | ICD-10-CM

## 2021-02-23 ENCOUNTER — Inpatient Hospital Stay: Payer: Medicare Other | Attending: Oncology

## 2021-02-23 ENCOUNTER — Other Ambulatory Visit: Payer: Self-pay

## 2021-02-23 ENCOUNTER — Inpatient Hospital Stay: Payer: Medicare Other

## 2021-02-23 DIAGNOSIS — D696 Thrombocytopenia, unspecified: Secondary | ICD-10-CM | POA: Insufficient documentation

## 2021-02-23 DIAGNOSIS — D631 Anemia in chronic kidney disease: Secondary | ICD-10-CM

## 2021-02-23 DIAGNOSIS — N186 End stage renal disease: Secondary | ICD-10-CM

## 2021-02-23 LAB — CBC WITH DIFFERENTIAL/PLATELET
Abs Immature Granulocytes: 0.11 10*3/uL — ABNORMAL HIGH (ref 0.00–0.07)
Basophils Absolute: 0 10*3/uL (ref 0.0–0.1)
Basophils Relative: 0 %
Eosinophils Absolute: 0 10*3/uL (ref 0.0–0.5)
Eosinophils Relative: 0 %
HCT: 20.6 % — ABNORMAL LOW (ref 39.0–52.0)
Hemoglobin: 7 g/dL — ABNORMAL LOW (ref 13.0–17.0)
Immature Granulocytes: 2 %
Lymphocytes Relative: 18 %
Lymphs Abs: 1.2 10*3/uL (ref 0.7–4.0)
MCH: 37.6 pg — ABNORMAL HIGH (ref 26.0–34.0)
MCHC: 34 g/dL (ref 30.0–36.0)
MCV: 110.8 fL — ABNORMAL HIGH (ref 80.0–100.0)
Monocytes Absolute: 0.4 10*3/uL (ref 0.1–1.0)
Monocytes Relative: 6 %
Neutro Abs: 5 10*3/uL (ref 1.7–7.7)
Neutrophils Relative %: 74 %
Platelets: 35 10*3/uL — ABNORMAL LOW (ref 150–400)
RBC: 1.86 MIL/uL — ABNORMAL LOW (ref 4.22–5.81)
RDW: 25.3 % — ABNORMAL HIGH (ref 11.5–15.5)
WBC: 6.7 10*3/uL (ref 4.0–10.5)
nRBC: 0.4 % — ABNORMAL HIGH (ref 0.0–0.2)

## 2021-02-23 LAB — RETIC PANEL
Immature Retic Fract: 24 % — ABNORMAL HIGH (ref 2.3–15.9)
RBC.: 1.85 MIL/uL — ABNORMAL LOW (ref 4.22–5.81)
Retic Count, Absolute: 186 10*3/uL (ref 19.0–186.0)
Retic Ct Pct: 10.1 % — ABNORMAL HIGH (ref 0.4–3.1)
Reticulocyte Hemoglobin: 41.3 pg (ref >27.9)

## 2021-02-23 LAB — IRON AND TIBC
Iron: 133 ug/dL (ref 45–182)
Saturation Ratios: 59 % — ABNORMAL HIGH (ref 17.9–39.5)
TIBC: 224 ug/dL — ABNORMAL LOW (ref 250–450)
UIBC: 91 ug/dL

## 2021-02-23 LAB — LACTATE DEHYDROGENASE: LDH: 249 U/L — ABNORMAL HIGH (ref 98–192)

## 2021-02-23 LAB — SAMPLE TO BLOOD BANK

## 2021-02-23 LAB — IMMATURE PLATELET FRACTION: Immature Platelet Fraction: 3 % (ref 1.2–8.6)

## 2021-02-23 LAB — FERRITIN: Ferritin: 1374 ng/mL — ABNORMAL HIGH (ref 24–336)

## 2021-02-23 MED ORDER — ROMIPLOSTIM INJECTION 500 MCG
3.0000 ug/kg | Freq: Once | SUBCUTANEOUS | Status: AC
  Administered 2021-02-23: 270 ug via SUBCUTANEOUS
  Filled 2021-02-23: qty 0.54

## 2021-02-24 DIAGNOSIS — N186 End stage renal disease: Principal | ICD-10-CM

## 2021-02-24 DIAGNOSIS — Z7682 Awaiting organ transplant status: Principal | ICD-10-CM

## 2021-02-24 DIAGNOSIS — Z01818 Encounter for other preprocedural examination: Principal | ICD-10-CM

## 2021-02-24 MED ORDER — POTASSIUM CHLORIDE ER 20 MEQ TABLET,EXTENDED RELEASE
ORAL_TABLET | Freq: Two times a day (BID) | ORAL | 1 refills | 0 days | Status: CP
Start: 2021-02-24 — End: ?

## 2021-02-26 ENCOUNTER — Ambulatory Visit: Admit: 2021-02-26 | Discharge: 2021-02-27 | Payer: MEDICARE

## 2021-02-27 ENCOUNTER — Ambulatory Visit: Admit: 2021-02-27 | Payer: MEDICARE

## 2021-02-27 ENCOUNTER — Ambulatory Visit: Admit: 2021-02-27 | Payer: MEDICARE | Attending: Internal Medicine | Primary: Internal Medicine

## 2021-03-02 ENCOUNTER — Other Ambulatory Visit: Payer: Self-pay

## 2021-03-02 ENCOUNTER — Inpatient Hospital Stay: Payer: Medicare Other

## 2021-03-02 ENCOUNTER — Inpatient Hospital Stay (HOSPITAL_BASED_OUTPATIENT_CLINIC_OR_DEPARTMENT_OTHER): Payer: Medicare Other | Admitting: Oncology

## 2021-03-02 ENCOUNTER — Encounter: Payer: Self-pay | Admitting: Oncology

## 2021-03-02 VITALS — BP 125/87 | HR 102 | Temp 97.7°F | Resp 18 | Wt 181.6 lb

## 2021-03-02 DIAGNOSIS — E538 Deficiency of other specified B group vitamins: Secondary | ICD-10-CM | POA: Diagnosis not present

## 2021-03-02 DIAGNOSIS — Z942 Lung transplant status: Secondary | ICD-10-CM

## 2021-03-02 DIAGNOSIS — B2790 Infectious mononucleosis, unspecified without complication: Secondary | ICD-10-CM

## 2021-03-02 DIAGNOSIS — D696 Thrombocytopenia, unspecified: Secondary | ICD-10-CM

## 2021-03-02 DIAGNOSIS — D631 Anemia in chronic kidney disease: Secondary | ICD-10-CM

## 2021-03-02 DIAGNOSIS — Z992 Dependence on renal dialysis: Secondary | ICD-10-CM

## 2021-03-02 DIAGNOSIS — N186 End stage renal disease: Secondary | ICD-10-CM | POA: Diagnosis not present

## 2021-03-02 LAB — CBC WITH DIFFERENTIAL/PLATELET
Abs Immature Granulocytes: 0.13 10*3/uL — ABNORMAL HIGH (ref 0.00–0.07)
Basophils Absolute: 0 10*3/uL (ref 0.0–0.1)
Basophils Relative: 0 %
Eosinophils Absolute: 0 10*3/uL (ref 0.0–0.5)
Eosinophils Relative: 0 %
HCT: 22.1 % — ABNORMAL LOW (ref 39.0–52.0)
Hemoglobin: 7.3 g/dL — ABNORMAL LOW (ref 13.0–17.0)
Immature Granulocytes: 1 %
Lymphocytes Relative: 16 %
Lymphs Abs: 1.5 10*3/uL (ref 0.7–4.0)
MCH: 39.2 pg — ABNORMAL HIGH (ref 26.0–34.0)
MCHC: 33 g/dL (ref 30.0–36.0)
MCV: 118.8 fL — ABNORMAL HIGH (ref 80.0–100.0)
Monocytes Absolute: 0.4 10*3/uL (ref 0.1–1.0)
Monocytes Relative: 4 %
Neutro Abs: 7.1 10*3/uL (ref 1.7–7.7)
Neutrophils Relative %: 79 %
Platelets: 37 10*3/uL — ABNORMAL LOW (ref 150–400)
RBC: 1.86 MIL/uL — ABNORMAL LOW (ref 4.22–5.81)
RDW: 26.5 % — ABNORMAL HIGH (ref 11.5–15.5)
Smear Review: NORMAL
WBC: 9.2 10*3/uL (ref 4.0–10.5)
nRBC: 2 % — ABNORMAL HIGH (ref 0.0–0.2)

## 2021-03-02 LAB — SAMPLE TO BLOOD BANK

## 2021-03-02 MED ORDER — ROMIPLOSTIM INJECTION 500 MCG
4.0000 ug/kg | Freq: Once | SUBCUTANEOUS | Status: AC
  Administered 2021-03-02: 330 ug via SUBCUTANEOUS
  Filled 2021-03-02: qty 0.66

## 2021-03-02 NOTE — Progress Notes (Signed)
Pt here for follow up. No new concerns voiced.   

## 2021-03-02 NOTE — Progress Notes (Signed)
Hematology/Oncology Progress  note [phone] [phone]      Patient Care Team: Franciso Bend, MD as PCP - General (Nephrology) Earlie Server, MD as Consulting Physician (Hematology and Oncology)  REFERRING PROVIDER: Franciso Bend, MD  CHIEF COMPLAINTS/REASON FOR VISIT:   thrombocytopenia  HISTORY OF PRESENTING ILLNESS:   Duane Petty is a  65 y.o.  male with PMH listed below was seen in consultation at the request of  Voora, Raven A, MD  for evaluation of thrombocytopenia.  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  Patient had work-up of thrombocytopenia including  bone marrow biopsy on 12/04/2020 which resulted with normocellular bone marrow and no significant morphological changes.  Thrombocytopenia was attributed to Imuran, bleeding was attributed to thrombocytopenia and uremia induced platelet dysfunction.  Patient has received desmopressin and received platelet transfusions with appropriate response.  Patient was admitted again in November 2022 to [facility] due to symptomatic hypotension, bright red per rectum on 01/24/2021.  Symptomatic anemia, hemoglobin was found to be below 7 and received 1 unit of PRBC transfusion.  Patient had embolization of hemorrhoid.  He was found to have a platelet count of 11,000 at admission.  Received platelet transfusion and Nplate 3 mcg/kg x 1 on 01/22/2021.  There is plan for patient to continue Nplate outpatient and the patient would like to establish care locally for transfusions and Nplate injections.  Patient follows up with nephrology Dr. Smith Mince at [facility]. Today patient was accompanied by his wife.  Denies any rectal bleeding.  No nausea vomiting, fever or chills.   INTERVAL HISTORY Duane Petty is a 65 y.o. male who has above history reviewed by me today  presents for follow up visit for management of thrombocytopenia. Patient has been weekly and platelet injections.  3 mcg/kg.  Patient tolerates treatments.  Denies any active bleeding.  Chronic fatigue, unchanged. Wife reports that patient was recently tested positive for EBV virus.  Review of Systems  Constitutional:  Positive for fatigue. Negative for chills and fever.  HENT:   Negative for hearing loss and voice change.   Eyes:  Negative for eye problems and icterus.  Respiratory:  Negative for chest tightness, cough and shortness of breath.   Cardiovascular:  Negative for chest pain and leg swelling.  Gastrointestinal:  Negative for abdominal distention, abdominal pain, nausea and vomiting.  Endocrine: Negative for hot flashes.  Genitourinary:  Negative for difficulty urinating, dysuria and frequency.   Musculoskeletal:  Negative for arthralgias.  Skin:  Negative for itching and rash.  Neurological:  Negative for light-headedness and numbness.  Hematological:  Negative for adenopathy. Bruises/bleeds easily.  Psychiatric/Behavioral:  Negative for confusion.    MEDICAL HISTORY:  Past Medical History:  Diagnosis Date   Allergy    Anemia    Anxiety    Blood transfusion without reported diagnosis    Cataract    Chronic kidney disease    COPD (chronic obstructive pulmonary disease) ([place])    Depression    GERD (gastroesophageal reflux disease)    Hypertension    Lung transplant recipient [facility])    Thrombocytopenia ([place]) 02/05/2021    SURGICAL HISTORY: Past Surgical History:  Procedure Laterality Date   LUNG TRANSPLANT Bilateral    pernial tube     vitreo-retinal surgery      SOCIAL HISTORY: Social History   Socioeconomic History  Marital status: Divorced    Spouse name: Not on file   Number of children: Not on file   Years of education: Not on file   Highest education level: Not on file  Occupational History   Not on file  Tobacco Use   Smoking status: Former     Types: Cigarettes   Smokeless tobacco: Not on file  Vaping Use   Vaping Use: Every day  Substance and Sexual Activity   Alcohol use: Not Currently   Drug use: Never   Sexual activity: Yes    Birth control/protection: Implant  Other Topics Concern   Not on file  Social History Narrative   Not on file   Social Determinants of Health   Financial Resource Strain: Not on file  Food Insecurity: Not on file  Transportation Needs: Not on file  Physical Activity: Not on file  Stress: Not on file  Social Connections: Not on file  Intimate Partner Violence: Not on file    FAMILY HISTORY: Family History  Problem Relation Age of Onset   Breast cancer Mother    Aneurysm Father     ALLERGIES:  has no allergies on file.  MEDICATIONS:  Current Outpatient Medications  Medication Sig Dispense Refill   allopurinol (ZYLOPRIM) 100 MG tablet Take 100 mg by mouth daily.     amLODipine (NORVASC) 5 MG tablet Take 5 mg by mouth daily. 1 Tablet(s) By Mouth Daily     azithromycin (ZITHROMAX) 250 MG tablet Take 250 mg by mouth daily. TAKE ONE TABLET EVERY DAY     Cholecalciferol (VITAMIN D3 PO) Take 1 tablet by mouth in the morning and at bedtime.     dextromethorphan (DELSYM) 30 MG/5ML liquid Take by mouth. Take 5 mL (30 mg total) by mouth every twelve (12) hours as needed for cough.     folic acid (V-R FOLIC ACID) 810 MCG tablet Take 1 tablet (400 mcg total) by mouth daily. 90 tablet 0   gentamicin cream (GARAMYCIN) 0.1 % Apply topically daily. SMARTSIG:Topical Daily     Magnesium 400 MG TABS Take 1 tablet by mouth daily.     Melatonin 10 MG TABS Take by mouth. Take 1 tablet (10 mg total) by mouth every evening.     Multiple Vitamin (MULTIVITAMIN) tablet Take 1 tablet by mouth daily.     niacinamide 500 MG tablet Take 500 mg by mouth 2 (two) times daily. 1 Tablet(s) By Mouth Twice Daily     omeprazole (PRILOSEC) 20 MG capsule omeprazole 20 mg capsule,delayed release     potassium chloride SA  (KLOR-CON M) 20 MEQ tablet Take 40 mEq by mouth 2 (two) times daily.     pravastatin (PRAVACHOL) 40 MG tablet pravastatin 40 mg tablet     predniSONE (DELTASONE) 5 MG tablet Take 1 tablet by mouth daily. Take 1 tablet (5 mg total) by mouth daily     tacrolimus (PROGRAF) 0.5 MG capsule Take by mouth. (0.5 mg total) by mouth two (2) times a day.     Zinc Sulfate (ZINC 15 PO) Take 1 tablet by mouth daily.     furosemide (LASIX) 80 MG tablet Take 80 mg by mouth daily.     No current facility-administered medications for this visit.     PHYSICAL EXAMINATION:  Vitals:   03/02/21 1452  BP: 125/87  Pulse: (!) 102  Resp: 18  Temp: 97.7 F (36.5 C)   Filed Weights   03/02/21 1452  Weight: 181 lb 9.6 oz (82.4  kg)    Physical Exam Constitutional:      General: He is not in acute distress.    Comments: Patient sits in the wheelchair.   HENT:     Head: Normocephalic and atraumatic.  Eyes:     General: No scleral icterus. Cardiovascular:     Rate and Rhythm: Normal rate and regular rhythm.     Heart sounds: Normal heart sounds.  Pulmonary:     Effort: Pulmonary effort is normal. No respiratory distress.     Breath sounds: No wheezing.  Abdominal:     General: Bowel sounds are normal. There is no distension.     Palpations: Abdomen is soft.  Musculoskeletal:        General: Normal range of motion.     Cervical back: Normal range of motion and neck supple.  Skin:    General: Skin is warm and dry.     Coloration: Skin is pale.  Neurological:     Mental Status: He is alert and oriented to person, place, and time. Mental status is at baseline.     Cranial Nerves: No cranial nerve deficit.     Coordination: Coordination normal.  Psychiatric:        Mood and Affect: Mood normal.    LABORATORY DATA:  I have reviewed the data as listed Lab Results  Component Value Date   WBC 9.2 03/02/2021   HGB 7.3 (L) 03/02/2021   HCT 22.1 (L) 03/02/2021   MCV 118.8 (H) 03/02/2021   PLT 37  (L) 03/02/2021   No results for input(s): NA, K, CL, CO2, GLUCOSE, BUN, CREATININE, CALCIUM, GFRNONAA, GFRAA, PROT, ALBUMIN, AST, ALT, ALKPHOS, BILITOT, BILIDIR, IBILI in the last 8760 hours. Iron/TIBC/Ferritin/ %Sat    Component Value Date/Time   IRON 133 02/23/2021 1542   TIBC 224 (L) 02/23/2021 1542   FERRITIN 1,374 (H) 02/23/2021 1542   IRONPCTSAT 59 (H) 02/23/2021 1542      RADIOGRAPHIC STUDIES: I have personally reviewed the radiological images as listed and agreed with the findings in the report. No results found.    ASSESSMENT & PLAN:  1. Thrombocytopenia ([place])   2. Anemia due to chronic kidney disease, on chronic dialysis ([place])   3. Folate deficiency   4. Lung transplant recipient [facility])   5. Chronic EBV infection    #Thrombocytopenia secondary to Imuran.  Recurrent rectal bleeding due to hemorrhoids status post embolization. Continue Nplate weekly.  Increase dose to 4 MCG/kg.  Repeat CBC in 2 weeks.  #Folate deficiency, mild.  Continue folic acid 287 MCG daily. #Anemia in CKD, hemoglobin 7.3, MCV 105.8- Patient is on Aranesp managed by [facility] nephrology. transfuse PRBC to keep hemoglobin above 7.  #History of lung transplant, follow-up with [facility].  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 #Chronic EBV infection.  This may contribute to his chronic anemia and thrombocytopenia.  Pending evaluation and management by lung transplant clinic at [facility].  Orders Placed This Encounter  Procedures   CBC with Differential/Platelet    Standing Status:   Future    Standing Expiration Date:   03/02/2022   Immature Platelet Fraction    Standing Status:   Future    Standing Expiration Date:   03/02/2022   CBC with Differential/Platelet    Standing Status:   Future    Standing Expiration Date:   03/02/2022    All questions were answered. The patient knows to call the clinic with any problems questions or concerns.  cc Voora,  Raven A, MD     Return of visit: N plate.  Weekly x3.  Repeat CBC in 2 weeks.  Follow-up in 4 weeks lab MD Nplate   Earlie Server, MD, PhD  03/02/2021

## 2021-03-03 ENCOUNTER — Encounter: Payer: Self-pay | Admitting: Oncology

## 2021-03-04 ENCOUNTER — Ambulatory Visit: Admit: 2021-03-04 | Discharge: 2021-03-05 | Payer: MEDICARE

## 2021-03-05 DIAGNOSIS — D828 Immunodeficiency associated with other specified major defects: Principal | ICD-10-CM

## 2021-03-05 DIAGNOSIS — Z942 Lung transplant status: Principal | ICD-10-CM

## 2021-03-05 DIAGNOSIS — R197 Diarrhea, unspecified: Principal | ICD-10-CM

## 2021-03-07 DIAGNOSIS — Z942 Lung transplant status: Principal | ICD-10-CM

## 2021-03-07 DIAGNOSIS — D801 Nonfamilial hypogammaglobulinemia: Principal | ICD-10-CM

## 2021-03-09 ENCOUNTER — Other Ambulatory Visit: Payer: Self-pay

## 2021-03-09 ENCOUNTER — Inpatient Hospital Stay: Payer: Medicare Other

## 2021-03-09 DIAGNOSIS — D696 Thrombocytopenia, unspecified: Secondary | ICD-10-CM | POA: Diagnosis not present

## 2021-03-09 DIAGNOSIS — N186 End stage renal disease: Secondary | ICD-10-CM

## 2021-03-09 LAB — CBC WITH DIFFERENTIAL/PLATELET
Abs Immature Granulocytes: 0.12 10*3/uL — ABNORMAL HIGH (ref 0.00–0.07)
Basophils Absolute: 0 10*3/uL (ref 0.0–0.1)
Basophils Relative: 0 %
Eosinophils Absolute: 0 10*3/uL (ref 0.0–0.5)
Eosinophils Relative: 0 %
HCT: 23.4 % — ABNORMAL LOW (ref 39.0–52.0)
Hemoglobin: 7.8 g/dL — ABNORMAL LOW (ref 13.0–17.0)
Immature Granulocytes: 1 %
Lymphocytes Relative: 11 %
Lymphs Abs: 1 10*3/uL (ref 0.7–4.0)
MCH: 40.8 pg — ABNORMAL HIGH (ref 26.0–34.0)
MCHC: 33.3 g/dL (ref 30.0–36.0)
MCV: 122.5 fL — ABNORMAL HIGH (ref 80.0–100.0)
Monocytes Absolute: 0.6 10*3/uL (ref 0.1–1.0)
Monocytes Relative: 7 %
Neutro Abs: 6.9 10*3/uL (ref 1.7–7.7)
Neutrophils Relative %: 81 %
Platelets: 31 10*3/uL — ABNORMAL LOW (ref 150–400)
RBC: 1.91 MIL/uL — ABNORMAL LOW (ref 4.22–5.81)
RDW: 26.4 % — ABNORMAL HIGH (ref 11.5–15.5)
WBC: 8.6 10*3/uL (ref 4.0–10.5)
nRBC: 3.3 % — ABNORMAL HIGH (ref 0.0–0.2)

## 2021-03-09 LAB — SAMPLE TO BLOOD BANK

## 2021-03-09 MED ORDER — ROMIPLOSTIM INJECTION 500 MCG
5.0000 ug/kg | Freq: Once | SUBCUTANEOUS | Status: AC
  Administered 2021-03-09: 16:00:00 410 ug via SUBCUTANEOUS
  Filled 2021-03-09: qty 0.82

## 2021-03-17 ENCOUNTER — Other Ambulatory Visit: Payer: Self-pay

## 2021-03-17 ENCOUNTER — Inpatient Hospital Stay: Payer: Medicare Other

## 2021-03-17 DIAGNOSIS — D696 Thrombocytopenia, unspecified: Secondary | ICD-10-CM

## 2021-03-17 LAB — CBC WITH DIFFERENTIAL/PLATELET
Abs Immature Granulocytes: 0.08 10*3/uL — ABNORMAL HIGH (ref 0.00–0.07)
Basophils Absolute: 0 10*3/uL (ref 0.0–0.1)
Basophils Relative: 0 %
Eosinophils Absolute: 0 10*3/uL (ref 0.0–0.5)
Eosinophils Relative: 0 %
HCT: 20.7 % — ABNORMAL LOW (ref 39.0–52.0)
Hemoglobin: 7 g/dL — ABNORMAL LOW (ref 13.0–17.0)
Immature Granulocytes: 1 %
Lymphocytes Relative: 17 %
Lymphs Abs: 1.3 10*3/uL (ref 0.7–4.0)
MCH: 41.7 pg — ABNORMAL HIGH (ref 26.0–34.0)
MCHC: 33.8 g/dL (ref 30.0–36.0)
MCV: 123.2 fL — ABNORMAL HIGH (ref 80.0–100.0)
Monocytes Absolute: 0.8 10*3/uL (ref 0.1–1.0)
Monocytes Relative: 10 %
Neutro Abs: 5.7 10*3/uL (ref 1.7–7.7)
Neutrophils Relative %: 72 %
Platelets: 35 10*3/uL — ABNORMAL LOW (ref 150–400)
RBC: 1.68 MIL/uL — ABNORMAL LOW (ref 4.22–5.81)
RDW: 26.4 % — ABNORMAL HIGH (ref 11.5–15.5)
Smear Review: NORMAL
WBC: 8 10*3/uL (ref 4.0–10.5)
nRBC: 5.4 % — ABNORMAL HIGH (ref 0.0–0.2)

## 2021-03-17 LAB — SAMPLE TO BLOOD BANK

## 2021-03-17 MED ORDER — ROMIPLOSTIM INJECTION 500 MCG
492.0000 ug | Freq: Once | SUBCUTANEOUS | Status: AC
  Administered 2021-03-17: 16:00:00 492 ug via SUBCUTANEOUS
  Filled 2021-03-17: qty 0.98

## 2021-03-18 ENCOUNTER — Telehealth: Payer: Self-pay

## 2021-03-18 NOTE — Telephone Encounter (Signed)
Can this be done today or another day this week?

## 2021-03-18 NOTE — Telephone Encounter (Signed)
No need for labs. He had Hold tube yesterday and its good until Friday.

## 2021-03-18 NOTE — Telephone Encounter (Signed)
-----   Message from Earlie Server, MD sent at 03/17/2021 10:09 PM EST ----- Please arrange him to get 1 unit of PRBC.please let me know date of transfusion via secure chat. I will order blood

## 2021-03-18 NOTE — Telephone Encounter (Signed)
Patient scheduled for 1 unit of blood at same day surgery on 12/29 @ 10 am. Pt's wife informed and agreeable.

## 2021-03-19 ENCOUNTER — Ambulatory Visit
Admission: RE | Admit: 2021-03-19 | Discharge: 2021-03-19 | Disposition: A | Discharge status: Home / Self Care | Payer: Medicare Other | Source: Ambulatory Visit | Attending: Oncology | Admitting: Oncology

## 2021-03-19 DIAGNOSIS — D649 Anemia, unspecified: Secondary | ICD-10-CM | POA: Diagnosis not present

## 2021-03-19 LAB — BPAM RBC
Blood Product Expiration Date: 202301062359
ISSUE DATE / TIME: 202212081015
Unit Type and Rh: 5100

## 2021-03-19 LAB — TYPE AND SCREEN
ABO/RH(D): B POS
Antibody Screen: NEGATIVE
Unit division: 0

## 2021-03-19 LAB — PREPARE RBC (CROSSMATCH)

## 2021-03-19 MED ORDER — HEPARIN SOD (PORK) LOCK FLUSH 100 UNIT/ML IV SOLN: 500.0000 [IU] | Freq: Every day | INTRAVENOUS | Status: DC | PRN

## 2021-03-19 MED ORDER — SODIUM CHLORIDE 0.9% FLUSH: 3.0000 mL | INTRAVENOUS | Status: DC | PRN

## 2021-03-19 MED ORDER — HEPARIN SOD (PORK) LOCK FLUSH 100 UNIT/ML IV SOLN: 250.0000 [IU] | INTRAVENOUS | Status: DC | PRN

## 2021-03-19 MED ORDER — ACETAMINOPHEN 325 MG PO TABS: 650.0000 mg | ORAL_TABLET | Freq: Once | ORAL | Status: AC

## 2021-03-19 MED ORDER — DIPHENHYDRAMINE HCL 25 MG PO CAPS: 25.0000 mg | ORAL_CAPSULE | Freq: Once | ORAL | Status: AC

## 2021-03-19 MED ORDER — FUROSEMIDE 10 MG/ML IJ SOLN
INTRAMUSCULAR | Status: AC
  Administered 2021-03-19: 14:00:00 20 mg via INTRAVENOUS
  Filled 2021-03-19: qty 2

## 2021-03-19 MED ORDER — SODIUM CHLORIDE 0.9% IV SOLUTION: 250.0000 mL | Freq: Once | INTRAVENOUS | Status: DC

## 2021-03-19 MED ORDER — SODIUM CHLORIDE 0.9% FLUSH: 10.0000 mL | INTRAVENOUS | Status: DC | PRN

## 2021-03-19 MED ORDER — ACETAMINOPHEN 325 MG PO TABS
ORAL_TABLET | ORAL | Status: AC
  Administered 2021-03-19: 12:00:00 650 mg via ORAL
  Filled 2021-03-19: qty 2

## 2021-03-19 MED ORDER — DIPHENHYDRAMINE HCL 25 MG PO CAPS
ORAL_CAPSULE | ORAL | Status: AC
  Administered 2021-03-19: 12:00:00 25 mg via ORAL
  Filled 2021-03-19: qty 1

## 2021-03-19 MED ORDER — FUROSEMIDE 10 MG/ML IJ SOLN: 20.0000 mg | Freq: Once | INTRAMUSCULAR | Status: AC

## 2021-03-20 LAB — BPAM RBC
Blood Product Expiration Date: 202301102359
ISSUE DATE / TIME: 202212291420
Unit Type and Rh: 9500

## 2021-03-20 LAB — TYPE AND SCREEN
ABO/RH(D): B POS
Antibody Screen: NEGATIVE
Unit division: 0

## 2021-03-24 ENCOUNTER — Inpatient Hospital Stay: Payer: Medicare Other | Attending: Oncology

## 2021-03-24 ENCOUNTER — Other Ambulatory Visit: Payer: Self-pay

## 2021-03-24 ENCOUNTER — Inpatient Hospital Stay: Payer: Medicare Other

## 2021-03-24 DIAGNOSIS — D631 Anemia in chronic kidney disease: Secondary | ICD-10-CM

## 2021-03-24 DIAGNOSIS — D696 Thrombocytopenia, unspecified: Secondary | ICD-10-CM | POA: Diagnosis present

## 2021-03-24 DIAGNOSIS — Z992 Dependence on renal dialysis: Secondary | ICD-10-CM

## 2021-03-24 LAB — CBC WITH DIFFERENTIAL/PLATELET
Abs Immature Granulocytes: 0.04 10*3/uL (ref 0.00–0.07)
Basophils Absolute: 0 10*3/uL (ref 0.0–0.1)
Basophils Relative: 0 %
Eosinophils Absolute: 0 10*3/uL (ref 0.0–0.5)
Eosinophils Relative: 0 %
HCT: 24.7 % — ABNORMAL LOW (ref 39.0–52.0)
Hemoglobin: 8.3 g/dL — ABNORMAL LOW (ref 13.0–17.0)
Immature Granulocytes: 1 %
Lymphocytes Relative: 16 %
Lymphs Abs: 0.7 10*3/uL (ref 0.7–4.0)
MCH: 39 pg — ABNORMAL HIGH (ref 26.0–34.0)
MCHC: 33.6 g/dL (ref 30.0–36.0)
MCV: 116 fL — ABNORMAL HIGH (ref 80.0–100.0)
Monocytes Absolute: 0.3 10*3/uL (ref 0.1–1.0)
Monocytes Relative: 7 %
Neutro Abs: 3.6 10*3/uL (ref 1.7–7.7)
Neutrophils Relative %: 76 %
Platelets: 58 10*3/uL — ABNORMAL LOW (ref 150–400)
RBC: 2.13 MIL/uL — ABNORMAL LOW (ref 4.22–5.81)
RDW: 28.2 % — ABNORMAL HIGH (ref 11.5–15.5)
WBC: 4.7 10*3/uL (ref 4.0–10.5)
nRBC: 0.4 % — ABNORMAL HIGH (ref 0.0–0.2)

## 2021-03-24 MED ORDER — ROMIPLOSTIM INJECTION 500 MCG
530.0000 ug | Freq: Once | SUBCUTANEOUS | Status: AC
  Administered 2021-03-24: 530 ug via SUBCUTANEOUS
  Filled 2021-03-24: qty 1.06

## 2021-03-30 ENCOUNTER — Encounter: Payer: Self-pay | Admitting: Oncology

## 2021-03-30 ENCOUNTER — Inpatient Hospital Stay: Payer: Medicare Other

## 2021-03-30 ENCOUNTER — Other Ambulatory Visit: Payer: Self-pay

## 2021-03-30 ENCOUNTER — Inpatient Hospital Stay (HOSPITAL_BASED_OUTPATIENT_CLINIC_OR_DEPARTMENT_OTHER): Payer: Medicare Other | Admitting: Oncology

## 2021-03-30 VITALS — BP 112/73 | HR 100 | Temp 98.5°F | Wt 174.1 lb

## 2021-03-30 DIAGNOSIS — Z942 Lung transplant status: Secondary | ICD-10-CM

## 2021-03-30 DIAGNOSIS — D696 Thrombocytopenia, unspecified: Secondary | ICD-10-CM

## 2021-03-30 DIAGNOSIS — E538 Deficiency of other specified B group vitamins: Secondary | ICD-10-CM

## 2021-03-30 DIAGNOSIS — N186 End stage renal disease: Secondary | ICD-10-CM | POA: Diagnosis not present

## 2021-03-30 DIAGNOSIS — B2790 Infectious mononucleosis, unspecified without complication: Secondary | ICD-10-CM

## 2021-03-30 DIAGNOSIS — D631 Anemia in chronic kidney disease: Secondary | ICD-10-CM

## 2021-03-30 DIAGNOSIS — Z992 Dependence on renal dialysis: Secondary | ICD-10-CM

## 2021-03-30 LAB — CBC WITH DIFFERENTIAL/PLATELET
Abs Immature Granulocytes: 0.08 10*3/uL — ABNORMAL HIGH (ref 0.00–0.07)
Basophils Absolute: 0 10*3/uL (ref 0.0–0.1)
Basophils Relative: 1 %
Eosinophils Absolute: 0 10*3/uL (ref 0.0–0.5)
Eosinophils Relative: 0 %
HCT: 24.4 % — ABNORMAL LOW (ref 39.0–52.0)
Hemoglobin: 8.2 g/dL — ABNORMAL LOW (ref 13.0–17.0)
Immature Granulocytes: 1 %
Lymphocytes Relative: 25 %
Lymphs Abs: 1.6 10*3/uL (ref 0.7–4.0)
MCH: 38.9 pg — ABNORMAL HIGH (ref 26.0–34.0)
MCHC: 33.6 g/dL (ref 30.0–36.0)
MCV: 115.6 fL — ABNORMAL HIGH (ref 80.0–100.0)
Monocytes Absolute: 0.9 10*3/uL (ref 0.1–1.0)
Monocytes Relative: 14 %
Neutro Abs: 3.6 10*3/uL (ref 1.7–7.7)
Neutrophils Relative %: 59 %
Platelets: 115 10*3/uL — ABNORMAL LOW (ref 150–400)
RBC: 2.11 MIL/uL — ABNORMAL LOW (ref 4.22–5.81)
RDW: 25.1 % — ABNORMAL HIGH (ref 11.5–15.5)
WBC: 6.1 10*3/uL (ref 4.0–10.5)
nRBC: 0 % (ref 0.0–0.2)

## 2021-03-30 LAB — IMMATURE PLATELET FRACTION: Immature Platelet Fraction: 2.5 % (ref 1.2–8.6)

## 2021-03-30 MED ORDER — ROMIPLOSTIM INJECTION 500 MCG
4.0000 ug/kg | Freq: Once | SUBCUTANEOUS | Status: AC
  Administered 2021-03-30: 315 ug via SUBCUTANEOUS
  Filled 2021-03-30: qty 0.63

## 2021-03-30 NOTE — Progress Notes (Signed)
Hematology/Oncology Progress  note [phone] [phone]      Patient Care Team: Franciso Bend, MD as PCP - General (Nephrology) Earlie Server, MD as Consulting Physician (Hematology and Oncology)  REFERRING PROVIDER: Franciso Bend, MD  CHIEF COMPLAINTS/REASON FOR VISIT:   thrombocytopenia  HISTORY OF PRESENTING ILLNESS:   Duane Petty is a  66 y.o.  male with PMH listed below was seen in consultation at the request of  Voora, Raven A, MD  for evaluation of thrombocytopenia.  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  Patient had work-up of thrombocytopenia including  bone marrow biopsy on 12/04/2020 which resulted with normocellular bone marrow and no significant morphological changes.  Thrombocytopenia was attributed to Imuran, bleeding was attributed to thrombocytopenia and uremia induced platelet dysfunction.  Patient has received desmopressin and received platelet transfusions with appropriate response.  Patient was admitted again in November 2022 to [facility] due to symptomatic hypotension, bright red per rectum on 01/24/2021.  Symptomatic anemia, hemoglobin was found to be below 7 and received 1 unit of PRBC transfusion.  Patient had embolization of hemorrhoid.  He was found to have a platelet count of 11,000 at admission.  Received platelet transfusion and Nplate 3 mcg/kg x 1 on 01/22/2021.  There is plan for patient to continue Nplate outpatient and the patient would like to establish care locally for transfusions and Nplate injections.  Patient follows up with nephrology Dr. Smith Mince at [facility]. Today patient was accompanied by his wife.  Denies any rectal bleeding.  No nausea vomiting, fever or chills.  # EBV copies 986--><100  INTERVAL HISTORY Duane Petty is a 66 y.o. male who has above history  reviewed by me today presents for follow up visit for management of thrombocytopenia. Patient has been weekly N-plate injections.   Most recently on 46mg/kg. S/p 1 unit of PRBC transfusion.  Today he reports feeling well. He has gained weight. Fatigue has improved.     Review of Systems  Constitutional:  Positive for fatigue. Negative for chills and fever.  HENT:   Negative for hearing loss and voice change.   Eyes:  Negative for eye problems and icterus.  Respiratory:  Negative for chest tightness, cough and shortness of breath.   Cardiovascular:  Negative for chest pain and leg swelling.  Gastrointestinal:  Negative for abdominal distention, abdominal pain, nausea and vomiting.  Endocrine: Negative for hot flashes.  Genitourinary:  Negative for difficulty urinating, dysuria and frequency.   Musculoskeletal:  Negative for arthralgias.  Skin:  Negative for itching and rash.  Neurological:  Negative for light-headedness and numbness.  Hematological:  Negative for adenopathy. Does not bruise/bleed easily.  Psychiatric/Behavioral:  Negative for confusion.    MEDICAL HISTORY:  Past Medical History:  Diagnosis Date   Allergy    Anemia    Anxiety    Blood transfusion without reported diagnosis    Cataract    Chronic kidney disease    COPD [place] disease[place]    Depression    GERD (gastroesophageal reflux disease)    Hypertension    Lung transplant recipient ([facility]    [place]    SURGICAL HISTORY: Past Surgical History:  Procedure Laterality Date   LUNG TRANSPLANT Bilateral    pernial tube     vitreo-retinal surgery      SOCIAL HISTORY: Social  History   Socioeconomic History   Marital status: Married    Spouse name: Not on file   Number of children: Not on file   Years of education: Not on file   Highest education level: Not on file  Occupational History   Not on file  Tobacco Use   Smoking status: Former     Types: Cigarettes   Smokeless tobacco: Not on file  Vaping Use   Vaping Use: Every day  Substance and Sexual Activity   Alcohol use: Not Currently   Drug use: Never   Sexual activity: Yes    Birth control/protection: Implant  Other Topics Concern   Not on file  Social History Narrative   Not on file   Social Determinants of Health   Financial Resource Strain: Not on file  Food Insecurity: Not on file  Transportation Needs: Not on file  Physical Activity: Not on file  Stress: Not on file  Social Connections: Not on file  Intimate Partner Violence: Not on file    FAMILY HISTORY: Family History  Problem Relation Age of Onset   Breast cancer Mother    Aneurysm Father     ALLERGIES:  has no allergies on file.  MEDICATIONS:  Current Outpatient Medications  Medication Sig Dispense Refill   allopurinol (ZYLOPRIM) 100 MG tablet Take 100 mg by mouth daily.     amLODipine (NORVASC) 5 MG tablet Take 5 mg by mouth daily. 1 Tablet(s) By Mouth Daily     azithromycin (ZITHROMAX) 250 MG tablet Take 250 mg by mouth daily. TAKE ONE TABLET EVERY DAY     Cholecalciferol (VITAMIN D3 PO) Take 1 tablet by mouth in the morning and at bedtime.     dextromethorphan (DELSYM) 30 MG/5ML liquid Take by mouth. Take 5 mL (30 mg total) by mouth every twelve (12) hours as needed for cough.     folic acid (V-R FOLIC ACID) 665 MCG tablet Take 1 tablet (400 mcg total) by mouth daily. 90 tablet 0   furosemide (LASIX) 80 MG tablet Take 80 mg by mouth daily.     gentamicin cream (GARAMYCIN) 0.1 % Apply topically daily. SMARTSIG:Topical Daily     Magnesium 400 MG TABS Take 1 tablet by mouth daily.     Melatonin 10 MG TABS Take by mouth. Take 1 tablet (10 mg total) by mouth every evening.     Multiple Vitamin (MULTIVITAMIN) tablet Take 1 tablet by mouth daily.     omeprazole (PRILOSEC) 20 MG capsule omeprazole 20 mg capsule,delayed release     potassium chloride SA (KLOR-CON M) 20 MEQ tablet Take 40 mEq by  mouth 2 (two) times daily.     pravastatin (PRAVACHOL) 40 MG tablet pravastatin 40 mg tablet     tacrolimus (PROGRAF) 0.5 MG capsule Take by mouth. (0.5 mg total) by mouth two (2) times a day.     Zinc Sulfate (ZINC 15 PO) Take 1 tablet by mouth daily.     niacinamide 500 MG tablet Take 500 mg by mouth 2 (two) times daily. 1 Tablet(s) By Mouth Twice Daily (Patient not taking: Reported on 03/30/2021)     No current facility-administered medications for this visit.     PHYSICAL EXAMINATION:  Vitals:   03/30/21 1507  BP: 112/73  Pulse: 100  Temp: 98.5 F (36.9 C)   Filed Weights   03/30/21 1507  Weight: 174 lb 1.6 oz (79 kg)    Physical Exam Constitutional:      General: He is  not in acute distress.    Comments: Patient sits in the wheelchair.   HENT:     Head: Normocephalic and atraumatic.  Eyes:     General: No scleral icterus. Cardiovascular:     Rate and Rhythm: Normal rate and regular rhythm.     Heart sounds: Normal heart sounds.  Pulmonary:     Effort: Pulmonary effort is normal. No respiratory distress.     Breath sounds: No wheezing.  Abdominal:     General: Bowel sounds are normal. There is no distension.     Palpations: Abdomen is soft.  Musculoskeletal:        General: Normal range of motion.     Cervical back: Normal range of motion and neck supple.  Skin:    General: Skin is warm and dry.     Coloration: Skin is pale.  Neurological:     Mental Status: He is alert and oriented to person, place, and time. Mental status is at baseline.     Cranial Nerves: No cranial nerve deficit.     Coordination: Coordination normal.  Psychiatric:        Mood and Affect: Mood normal.    LABORATORY DATA:  I have reviewed the data as listed Lab Results  Component Value Date   WBC 6.1 03/30/2021   HGB 8.2 (L) 03/30/2021   HCT 24.4 (L) 03/30/2021   MCV 115.6 (H) 03/30/2021   PLT 115 (L) 03/30/2021   No results for input(s): NA, K, CL, CO2, GLUCOSE, BUN, CREATININE,  CALCIUM, GFRNONAA, GFRAA, PROT, ALBUMIN, AST, ALT, ALKPHOS, BILITOT, BILIDIR, IBILI in the last 8760 hours. Iron/TIBC/Ferritin/ %Sat    Component Value Date/Time   IRON 133 02/23/2021 1542   TIBC 224 (L) 02/23/2021 1542   FERRITIN 1,374 (H) 02/23/2021 1542   IRONPCTSAT 59 (H) 02/23/2021 1542      RADIOGRAPHIC STUDIES: I have personally reviewed the radiological images as listed and agreed with the findings in the report. No results found.    ASSESSMENT & PLAN:  1. Thrombocytopenia ([place])   2. Anemia due to chronic kidney disease, on chronic dialysis ([place])   3. Folate deficiency   4. Lung transplant recipient [facility])   5. Chronic EBV infection    #Thrombocytopenia secondary to immuno suppressant and possible EBV marrow suppression.  Labs are reviewed and discussed with patient. Platelet count has improved to 115,000.  I will decrease N plate to 81mg/kg weekly.   #Anemia in CKD [facility]. S/p 1 unit of PRBC recently. Hb has improved.   #History of lung transplant, follow-up with [facility].  Imuran, tacrolimus #Hyperimmunoglobulinemia [facility] #Chronic EBV infection.  This may contribute to his chronic anemia and thrombocytopenia.  Per record via care everywhere, EBV trends down to be <100.   # Folate deficiency, continue folate supplementation. I will repeat folate at next visit.    Orders Placed This Encounter  Procedures   CBC with Differential/Platelet    Standing Status:   Standing    Number of Occurrences:   3    Standing Expiration Date:   03/30/2022   CBC with Differential/Platelet    Standing Status:   Future    Standing Expiration Date:   03/30/2022   Immature Platelet Fraction    Standing Status:   Future    Standing Expiration Date:   03/30/2022    All questions were answered. The patient knows to call the clinic with any problems  questions or concerns.  cc Franciso Bend, MD    Return of visit: N  plate.  Weekly CBC + N plate x3.   Follow-up in 4 weeks lab MD Nplate   Earlie Server, MD, PhD  03/30/2021

## 2021-03-31 ENCOUNTER — Ambulatory Visit: Payer: Medicare Other

## 2021-04-06 ENCOUNTER — Inpatient Hospital Stay: Payer: Medicare Other

## 2021-04-06 ENCOUNTER — Other Ambulatory Visit: Payer: Self-pay

## 2021-04-06 DIAGNOSIS — D696 Thrombocytopenia, unspecified: Secondary | ICD-10-CM

## 2021-04-06 DIAGNOSIS — D631 Anemia in chronic kidney disease: Secondary | ICD-10-CM

## 2021-04-06 DIAGNOSIS — N186 End stage renal disease: Secondary | ICD-10-CM

## 2021-04-06 LAB — CBC WITH DIFFERENTIAL/PLATELET
Abs Immature Granulocytes: 0.1 10*3/uL — ABNORMAL HIGH (ref 0.00–0.07)
Basophils Absolute: 0 10*3/uL (ref 0.0–0.1)
Basophils Relative: 0 %
Eosinophils Absolute: 0 10*3/uL (ref 0.0–0.5)
Eosinophils Relative: 0 %
HCT: 26.3 % — ABNORMAL LOW (ref 39.0–52.0)
Hemoglobin: 8.8 g/dL — ABNORMAL LOW (ref 13.0–17.0)
Immature Granulocytes: 1 %
Lymphocytes Relative: 13 %
Lymphs Abs: 1 10*3/uL (ref 0.7–4.0)
MCH: 39.3 pg — ABNORMAL HIGH (ref 26.0–34.0)
MCHC: 33.5 g/dL (ref 30.0–36.0)
MCV: 117.4 fL — ABNORMAL HIGH (ref 80.0–100.0)
Monocytes Absolute: 0.3 10*3/uL (ref 0.1–1.0)
Monocytes Relative: 5 %
Neutro Abs: 5.8 10*3/uL (ref 1.7–7.7)
Neutrophils Relative %: 81 %
Platelets: 258 10*3/uL (ref 150–400)
RBC: 2.24 MIL/uL — ABNORMAL LOW (ref 4.22–5.81)
RDW: 24.1 % — ABNORMAL HIGH (ref 11.5–15.5)
WBC: 7.3 10*3/uL (ref 4.0–10.5)
nRBC: 0.5 % — ABNORMAL HIGH (ref 0.0–0.2)

## 2021-04-06 LAB — SAMPLE TO BLOOD BANK

## 2021-04-07 ENCOUNTER — Encounter: Admit: 2021-04-07 | Discharge: 2021-04-07 | Payer: MEDICARE | Attending: Surgery | Primary: Surgery

## 2021-04-10 ENCOUNTER — Ambulatory Visit: Admit: 2021-04-10 | Discharge: 2021-04-11 | Payer: MEDICARE

## 2021-04-10 ENCOUNTER — Encounter: Admit: 2021-04-10 | Discharge: 2021-04-11 | Payer: MEDICARE | Attending: Surgery | Primary: Surgery

## 2021-04-10 DIAGNOSIS — Z942 Lung transplant status: Principal | ICD-10-CM

## 2021-04-13 ENCOUNTER — Inpatient Hospital Stay: Payer: Medicare Other

## 2021-04-13 ENCOUNTER — Other Ambulatory Visit: Payer: Self-pay

## 2021-04-13 DIAGNOSIS — D696 Thrombocytopenia, unspecified: Secondary | ICD-10-CM

## 2021-04-13 DIAGNOSIS — Z7682 Awaiting organ transplant status: Principal | ICD-10-CM

## 2021-04-13 DIAGNOSIS — N186 End stage renal disease: Principal | ICD-10-CM

## 2021-04-13 DIAGNOSIS — Z01818 Encounter for other preprocedural examination: Principal | ICD-10-CM

## 2021-04-13 LAB — CBC WITH DIFFERENTIAL/PLATELET
Abs Immature Granulocytes: 0.19 10*3/uL — ABNORMAL HIGH (ref 0.00–0.07)
Basophils Absolute: 0.1 10*3/uL (ref 0.0–0.1)
Basophils Relative: 1 %
Eosinophils Absolute: 0 10*3/uL (ref 0.0–0.5)
Eosinophils Relative: 0 %
HCT: 27.9 % — ABNORMAL LOW (ref 39.0–52.0)
Hemoglobin: 9 g/dL — ABNORMAL LOW (ref 13.0–17.0)
Immature Granulocytes: 2 %
Lymphocytes Relative: 12 %
Lymphs Abs: 1 10*3/uL (ref 0.7–4.0)
MCH: 38.5 pg — ABNORMAL HIGH (ref 26.0–34.0)
MCHC: 32.3 g/dL (ref 30.0–36.0)
MCV: 119.2 fL — ABNORMAL HIGH (ref 80.0–100.0)
Monocytes Absolute: 0.6 10*3/uL (ref 0.1–1.0)
Monocytes Relative: 7 %
Neutro Abs: 6.7 10*3/uL (ref 1.7–7.7)
Neutrophils Relative %: 78 %
Platelets: 408 10*3/uL — ABNORMAL HIGH (ref 150–400)
RBC: 2.34 MIL/uL — ABNORMAL LOW (ref 4.22–5.81)
RDW: 22.3 % — ABNORMAL HIGH (ref 11.5–15.5)
WBC: 8.6 10*3/uL (ref 4.0–10.5)
nRBC: 0 % (ref 0.0–0.2)

## 2021-04-14 DIAGNOSIS — Z942 Lung transplant status: Principal | ICD-10-CM

## 2021-04-17 DIAGNOSIS — K4090 Unilateral inguinal hernia, without obstruction or gangrene, not specified as recurrent: Principal | ICD-10-CM

## 2021-04-20 ENCOUNTER — Other Ambulatory Visit: Payer: Self-pay

## 2021-04-20 ENCOUNTER — Inpatient Hospital Stay: Payer: Medicare Other

## 2021-04-20 DIAGNOSIS — D631 Anemia in chronic kidney disease: Secondary | ICD-10-CM

## 2021-04-20 DIAGNOSIS — N186 End stage renal disease: Secondary | ICD-10-CM

## 2021-04-20 DIAGNOSIS — D696 Thrombocytopenia, unspecified: Secondary | ICD-10-CM | POA: Diagnosis not present

## 2021-04-20 LAB — CBC WITH DIFFERENTIAL/PLATELET
Abs Immature Granulocytes: 0.22 10*3/uL — ABNORMAL HIGH (ref 0.00–0.07)
Basophils Absolute: 0.1 10*3/uL (ref 0.0–0.1)
Basophils Relative: 1 %
Eosinophils Absolute: 0 10*3/uL (ref 0.0–0.5)
Eosinophils Relative: 0 %
HCT: 29.2 % — ABNORMAL LOW (ref 39.0–52.0)
Hemoglobin: 9.6 g/dL — ABNORMAL LOW (ref 13.0–17.0)
Immature Granulocytes: 2 %
Lymphocytes Relative: 10 %
Lymphs Abs: 1.2 10*3/uL (ref 0.7–4.0)
MCH: 37.8 pg — ABNORMAL HIGH (ref 26.0–34.0)
MCHC: 32.9 g/dL (ref 30.0–36.0)
MCV: 115 fL — ABNORMAL HIGH (ref 80.0–100.0)
Monocytes Absolute: 0.7 10*3/uL (ref 0.1–1.0)
Monocytes Relative: 6 %
Neutro Abs: 9.5 10*3/uL — ABNORMAL HIGH (ref 1.7–7.7)
Neutrophils Relative %: 81 %
Platelets: 624 10*3/uL — ABNORMAL HIGH (ref 150–400)
RBC: 2.54 MIL/uL — ABNORMAL LOW (ref 4.22–5.81)
RDW: 18.6 % — ABNORMAL HIGH (ref 11.5–15.5)
WBC: 11.7 10*3/uL — ABNORMAL HIGH (ref 4.0–10.5)
nRBC: 0 % (ref 0.0–0.2)

## 2021-04-24 ENCOUNTER — Ambulatory Visit: Admit: 2021-04-24 | Discharge: 2021-04-24 | Payer: MEDICARE

## 2021-04-24 DIAGNOSIS — Z942 Lung transplant status: Principal | ICD-10-CM

## 2021-04-24 DIAGNOSIS — D801 Nonfamilial hypogammaglobulinemia: Principal | ICD-10-CM

## 2021-04-24 MED ORDER — PRAVASTATIN 40 MG TABLET
ORAL_TABLET | 3 refills | 0 days | Status: CP
Start: 2021-04-24 — End: ?

## 2021-04-27 ENCOUNTER — Inpatient Hospital Stay: Payer: Medicare Other | Attending: Oncology | Admitting: Oncology

## 2021-04-27 ENCOUNTER — Other Ambulatory Visit: Payer: Self-pay

## 2021-04-27 ENCOUNTER — Inpatient Hospital Stay: Payer: Medicare Other

## 2021-04-27 ENCOUNTER — Encounter: Payer: Self-pay | Admitting: Oncology

## 2021-04-27 VITALS — BP 134/93 | HR 94 | Temp 98.7°F | Resp 20 | Wt 181.7 lb

## 2021-04-27 DIAGNOSIS — D696 Thrombocytopenia, unspecified: Secondary | ICD-10-CM | POA: Insufficient documentation

## 2021-04-27 DIAGNOSIS — D631 Anemia in chronic kidney disease: Secondary | ICD-10-CM | POA: Diagnosis not present

## 2021-04-27 DIAGNOSIS — K4090 Unilateral inguinal hernia, without obstruction or gangrene, not specified as recurrent: Secondary | ICD-10-CM | POA: Insufficient documentation

## 2021-04-27 DIAGNOSIS — Z942 Lung transplant status: Secondary | ICD-10-CM | POA: Insufficient documentation

## 2021-04-27 DIAGNOSIS — N186 End stage renal disease: Secondary | ICD-10-CM | POA: Diagnosis not present

## 2021-04-27 DIAGNOSIS — Z992 Dependence on renal dialysis: Secondary | ICD-10-CM | POA: Insufficient documentation

## 2021-04-27 MED ORDER — OLMESARTAN 5 MG TABLET
ORAL_TABLET | Freq: Every evening | ORAL | 3 refills | 45.00000 days | Status: CP
Start: 2021-04-27 — End: 2022-04-27

## 2021-04-27 NOTE — Progress Notes (Signed)
Hematology/Oncology Progress  note [phone] [phone]      Patient Care Team: Franciso Bend, MD as PCP - General (Nephrology) Earlie Server, MD as Consulting Physician (Hematology and Oncology)  REFERRING PROVIDER: Franciso Bend, MD  CHIEF COMPLAINTS/REASON FOR VISIT:   thrombocytopenia  HISTORY OF PRESENTING ILLNESS:   Duane Petty is a  66 y.o.  male with PMH listed below was seen in consultation at the request of  Voora, Raven A, MD  for evaluation of thrombocytopenia.  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  Patient had work-up of thrombocytopenia including  bone marrow biopsy on 12/04/2020 which resulted with normocellular bone marrow and no significant morphological changes.  Thrombocytopenia was attributed to Imuran, bleeding was attributed to thrombocytopenia and uremia induced platelet dysfunction.  Patient has received desmopressin and received platelet transfusions with appropriate response.  Patient was admitted again in November 2022 to [facility] due to symptomatic hypotension, bright red per rectum on 01/24/2021.  Symptomatic anemia, hemoglobin was found to be below 7 and received 1 unit of PRBC transfusion.  Patient had embolization of hemorrhoid.  He was found to have a platelet count of 11,000 at admission.  Received platelet transfusion and Nplate 3 mcg/kg x 1 on 01/22/2021.  There is plan for patient to continue Nplate outpatient and the patient would like to establish care locally for transfusions and Nplate injections.  Patient follows up with nephrology Dr. Smith Mince at [facility]. Today patient was accompanied by his wife.  Denies any rectal bleeding.  No nausea vomiting, fever or chills.  # EBV copies 986--><100  INTERVAL HISTORY Duane Petty is a 66 y.o. male who has above history  reviewed by me today presents for follow up visit for management of thrombocytopenia. Patient was previously on weekly N-plate injections, with the higher dose being 5 mcg/kilogram and taper down to 3 mcg/kilogram.  Last received on 03/30/2021. He has improved significantly since last visit. No new complaints.  Review of Systems  Constitutional:  Positive for fatigue. Negative for chills and fever.  HENT:   Negative for hearing loss and voice change.   Eyes:  Negative for eye problems and icterus.  Respiratory:  Negative for chest tightness, cough and shortness of breath.   Cardiovascular:  Negative for chest pain and leg swelling.  Gastrointestinal:  Negative for abdominal distention, abdominal pain, nausea and vomiting.  Endocrine: Negative for hot flashes.  Genitourinary:  Negative for difficulty urinating, dysuria and frequency.   Musculoskeletal:  Negative for arthralgias.  Skin:  Negative for itching and rash.  Neurological:  Negative for light-headedness and numbness.  Hematological:  Negative for adenopathy. Does not bruise/bleed easily.  Psychiatric/Behavioral:  Negative for confusion.    MEDICAL HISTORY:  Past Medical History:  Diagnosis Date   Allergy    Anemia    Anxiety    Blood transfusion without reported diagnosis    Cataract    Chronic kidney disease    COPD (chronic obstructive pulmonary disease) ([place])    Depression    GERD (gastroesophageal reflux disease)    Hypertension    Lung transplant recipient [facility])    Thrombocytopenia ([place]) 02/05/2021    SURGICAL HISTORY: Past Surgical History:  Procedure Laterality Date   LUNG TRANSPLANT Bilateral    pernial tube     vitreo-retinal surgery      SOCIAL HISTORY:  Social History   Socioeconomic History   Marital status: Married    Spouse name: Not on file   Number of children: Not on file   Years of education: Not on file   Highest education level: Not on file  Occupational History   Not on file  Tobacco  Use   Smoking status: Former    Types: Cigarettes   Smokeless tobacco: Not on file  Vaping Use   Vaping Use: Every day  Substance and Sexual Activity   Alcohol use: Not Currently   Drug use: Never   Sexual activity: Yes    Birth control/protection: Implant  Other Topics Concern   Not on file  Social History Narrative   Not on file   Social Determinants of Health   Financial Resource Strain: Not on file  Food Insecurity: Not on file  Transportation Needs: Not on file  Physical Activity: Not on file  Stress: Not on file  Social Connections: Not on file  Intimate Partner Violence: Not on file    FAMILY HISTORY: Family History  Problem Relation Age of Onset   Breast cancer Mother    Aneurysm Father     ALLERGIES:  is allergic to iodinated contrast media, shrimp extract allergy skin test, lisinopril, and losartan.  MEDICATIONS:  Current Outpatient Medications  Medication Sig Dispense Refill   allopurinol (ZYLOPRIM) 100 MG tablet Take 100 mg by mouth daily.     amLODipine (NORVASC) 5 MG tablet Take 5 mg by mouth daily. 1 Tablet(s) By Mouth Daily     azithromycin (ZITHROMAX) 250 MG tablet Take 250 mg by mouth daily. TAKE ONE TABLET EVERY DAY     Cholecalciferol (VITAMIN D3 PO) Take 1 tablet by mouth in the morning and at bedtime.     dextromethorphan (DELSYM) 30 MG/5ML liquid Take by mouth. Take 5 mL (30 mg total) by mouth every twelve (12) hours as needed for cough.     ECOTRIN LOW STRENGTH 81 MG EC tablet Take 81 mg by mouth daily.     folic acid (V-R FOLIC ACID) 294 MCG tablet Take 1 tablet (400 mcg total) by mouth daily. 90 tablet 0   furosemide (LASIX) 80 MG tablet Take 80 mg by mouth daily.     Magnesium 400 MG TABS Take 1 tablet by mouth daily.     Melatonin 10 MG TABS Take by mouth. Take 1 tablet (10 mg total) by mouth every evening.     Multiple Vitamin (MULTIVITAMIN) tablet Take 1 tablet by mouth daily.     omeprazole (PRILOSEC) 20 MG capsule omeprazole 20 mg  capsule,delayed release     pravastatin (PRAVACHOL) 40 MG tablet pravastatin 40 mg tablet     predniSONE (STERAPRED UNI-PAK 48 TAB) 5 MG (48) TBPK tablet Take 5 mg by mouth daily.     tacrolimus (PROGRAF) 0.5 MG capsule Take by mouth. (0.5 mg total) by mouth two (2) times a day.     Zinc Sulfate (ZINC 15 PO) Take 1 tablet by mouth daily.     ZOLOFT 25 MG tablet Take 25 mg by mouth daily.     gentamicin cream (GARAMYCIN) 0.1 % Apply topically daily. SMARTSIG:Topical Daily (Patient not taking: Reported on 04/27/2021)     niacinamide 500 MG tablet Take 500 mg by mouth 2 (two) times daily. 1 Tablet(s) By Mouth Twice Daily (Patient not taking: Reported on 03/30/2021)     potassium chloride SA (KLOR-CON M) 20 MEQ tablet Take 40 mEq by mouth 2 (two)  times daily. (Patient not taking: Reported on 04/27/2021)     No current facility-administered medications for this visit.     PHYSICAL EXAMINATION:  Vitals:   04/27/21 1429  BP: (!) 134/93  Pulse: 94  Resp: 20  Temp: 98.7 F (37.1 C)  SpO2: 100%   Filed Weights   04/27/21 1429  Weight: 181 lb 11.2 oz (82.4 kg)    Physical Exam Constitutional:      General: He is not in acute distress.    Comments: Patient ambulates independently.  HENT:     Head: Normocephalic and atraumatic.  Eyes:     General: No scleral icterus. Cardiovascular:     Rate and Rhythm: Normal rate and regular rhythm.     Heart sounds: Normal heart sounds.  Pulmonary:     Effort: Pulmonary effort is normal. No respiratory distress.     Breath sounds: No wheezing.  Abdominal:     General: Bowel sounds are normal. There is no distension.     Palpations: Abdomen is soft.  Musculoskeletal:        General: Normal range of motion.     Cervical back: Normal range of motion and neck supple.  Skin:    General: Skin is warm and dry.  Neurological:     Mental Status: He is alert and oriented to person, place, and time. Mental status is at baseline.     Cranial Nerves: No  cranial nerve deficit.     Coordination: Coordination normal.  Psychiatric:        Mood and Affect: Mood normal.    LABORATORY DATA:  I have reviewed the data as listed Lab Results  Component Value Date   WBC 11.7 (H) 04/20/2021   HGB 9.6 (L) 04/20/2021   HCT 29.2 (L) 04/20/2021   MCV 115.0 (H) 04/20/2021   PLT 624 (H) 04/20/2021   No results for input(s): NA, K, CL, CO2, GLUCOSE, BUN, CREATININE, CALCIUM, GFRNONAA, GFRAA, PROT, ALBUMIN, AST, ALT, ALKPHOS, BILITOT, BILIDIR, IBILI in the last 8760 hours. Iron/TIBC/Ferritin/ %Sat    Component Value Date/Time   IRON 133 02/23/2021 1542   TIBC 224 (L) 02/23/2021 1542   FERRITIN 1,374 (H) 02/23/2021 1542   IRONPCTSAT 59 (H) 02/23/2021 1542      RADIOGRAPHIC STUDIES: I have personally reviewed the radiological images as listed and agreed with the findings in the report. No results found.    ASSESSMENT & PLAN:  1. Thrombocytopenia ([place])   2. Inguinal hernia without obstruction or gangrene, recurrence not specified, unspecified laterality   3. 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  Patient recently had a blood work done which showed that thrombocytopenia has completely resolved.  He has some thrombocytosis, recent labs showed a level of 700,000 This is likely due to previous N plate I discussed with the patient and his wife that it looks like his bone marrow has recovered from suppression. Hold off additional N plate injection.  #Anemia in CKD Patient is on Aranesp managed by [facility] nephrology. 04/24/2021, hemoglobin 9.6.  #History of lung transplant, follow-up with [facility].  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 #Chronic EBV infection.  This may contribute to his chronic anemia and thrombocytopenia.  Per record via care everywhere, EBV trends down to be <100.   #Inguinal hernia  on the left side, no obstruction.  Patient has an appointment with you tomorrow surgery.  Really wants to have an opinion with our local surgeon to see if such procedure could be done locally.  We will refer patient to see Dr. Peyton Najjar for an opinion.   For now since his thrombocytopenia has improved, no need for additional and platelet injections, or not schedule patient to follow-up at this point.  He has multiple other providers and he plans to get CBC done in the near future.  I recommend patient to call my office and reschedule follow-up appointment if his Perrett count drops below 150,000.  At that point, we may resume routine follow-up.  Patient agrees with the plan.  Orders Placed This Encounter  Procedures   Ambulatory referral to General Surgery    Referral Priority:   Routine    Referral Type:   Surgical    Referral Reason:   Specialty Services Required    Requested Specialty:   General Surgery    Number of Visits Requested:   1    All questions were answered. The patient knows to call the clinic with any problems questions or concerns.  cc Franciso Bend, MD     Earlie Server, MD, PhD  04/27/2021

## 2021-04-27 NOTE — Progress Notes (Signed)
Patient states no concerns at the moment. 

## 2021-04-30 MED ORDER — SEVELAMER CARBONATE 800 MG TABLET
ORAL_TABLET | Freq: Three times a day (TID) | ORAL | 11 refills | 30.00000 days | Status: CP
Start: 2021-04-30 — End: 2022-04-30

## 2021-05-13 MED ORDER — TRAZODONE 50 MG TABLET
ORAL_TABLET | Freq: Every evening | ORAL | 3 refills | 90 days | Status: CP
Start: 2021-05-13 — End: 2021-06-12

## 2021-05-15 DIAGNOSIS — Z942 Lung transplant status: Principal | ICD-10-CM

## 2021-05-15 MED ORDER — PREDNISONE 5 MG TABLET
ORAL_TABLET | Freq: Every day | ORAL | 3 refills | 90 days | Status: CP
Start: 2021-05-15 — End: 2022-05-15

## 2021-05-15 MED ORDER — SERTRALINE 50 MG TABLET
ORAL_TABLET | Freq: Every day | ORAL | 0 refills | 30.00000 days | Status: CP
Start: 2021-05-15 — End: 2021-06-14

## 2021-05-19 ENCOUNTER — Ambulatory Visit: Admit: 2021-05-19 | Discharge: 2021-05-20 | Payer: MEDICARE

## 2021-05-20 DIAGNOSIS — Z942 Lung transplant status: Principal | ICD-10-CM

## 2021-05-21 ENCOUNTER — Encounter: Admit: 2021-05-21 | Discharge: 2021-05-21 | Payer: MEDICARE

## 2021-05-26 ENCOUNTER — Ambulatory Visit: Admit: 2021-05-26 | Discharge: 2021-05-27 | Payer: MEDICARE | Attending: Surgery | Primary: Surgery

## 2021-06-01 DIAGNOSIS — N186 End stage renal disease: Principal | ICD-10-CM

## 2021-06-01 DIAGNOSIS — Z7682 Awaiting organ transplant status: Principal | ICD-10-CM

## 2021-06-01 DIAGNOSIS — Z01818 Encounter for other preprocedural examination: Principal | ICD-10-CM

## 2021-06-05 ENCOUNTER — Ambulatory Visit: Admit: 2021-06-05 | Discharge: 2021-06-06 | Payer: MEDICARE

## 2021-06-05 ENCOUNTER — Ambulatory Visit: Admit: 2021-06-05 | Discharge: 2021-06-06 | Payer: MEDICARE | Attending: Registered" | Primary: Registered"

## 2021-06-05 ENCOUNTER — Ambulatory Visit: Admit: 2021-06-05 | Discharge: 2021-06-06 | Payer: MEDICARE | Attending: Internal Medicine | Primary: Internal Medicine

## 2021-06-05 ENCOUNTER — Encounter: Admit: 2021-06-05 | Discharge: 2021-06-06 | Payer: MEDICARE | Attending: Internal Medicine | Primary: Internal Medicine

## 2021-06-05 DIAGNOSIS — Z942 Lung transplant status: Principal | ICD-10-CM

## 2021-06-05 MED ORDER — OMEPRAZOLE 40 MG CAPSULE,DELAYED RELEASE
ORAL_CAPSULE | Freq: Every day | ORAL | 3 refills | 90 days | Status: CP
Start: 2021-06-05 — End: 2022-06-05

## 2021-06-05 MED ORDER — SULFAMETHOXAZOLE 400 MG-TRIMETHOPRIM 80 MG TABLET
ORAL_TABLET | ORAL | 3 refills | 84 days | Status: CP
Start: 2021-06-05 — End: ?

## 2021-06-05 MED ORDER — OLMESARTAN 5 MG TABLET
ORAL_TABLET | Freq: Every day | ORAL | 3 refills | 90 days | Status: CP
Start: 2021-06-05 — End: 2022-06-05

## 2021-06-05 MED ORDER — SERTRALINE 50 MG TABLET
ORAL_TABLET | Freq: Every day | ORAL | 3 refills | 90 days | Status: CP
Start: 2021-06-05 — End: ?

## 2021-06-05 MED ORDER — CHOLECALCIFEROL (VITAMIN D3) 25 MCG (1,000 UNIT) TABLET
ORAL_TABLET | Freq: Two times a day (BID) | ORAL | 3 refills | 90 days | Status: CP
Start: 2021-06-05 — End: 2022-06-05

## 2021-06-05 MED ORDER — TACROLIMUS 0.5 MG CAPSULE, IMMEDIATE-RELEASE
ORAL_CAPSULE | Freq: Two times a day (BID) | ORAL | 3 refills | 90.00000 days | Status: CP
Start: 2021-06-05 — End: 2022-06-05

## 2021-06-05 MED ORDER — MYCOPHENOLATE MOFETIL 250 MG CAPSULE
ORAL_CAPSULE | Freq: Every day | ORAL | 3 refills | 90 days | Status: CP
Start: 2021-06-05 — End: 2022-06-05

## 2021-06-08 DIAGNOSIS — K4031 Unilateral inguinal hernia, with obstruction, without gangrene, recurrent: Principal | ICD-10-CM

## 2021-06-08 DIAGNOSIS — K439 Ventral hernia without obstruction or gangrene: Principal | ICD-10-CM

## 2021-06-08 MED ORDER — TACROLIMUS 0.5 MG CAPSULE, IMMEDIATE-RELEASE
ORAL_CAPSULE | ORAL | 3 refills | 120 days
Start: 2021-06-08 — End: ?

## 2021-06-08 MED ORDER — SEVELAMER CARBONATE 800 MG TABLET
ORAL_TABLET | Freq: Three times a day (TID) | ORAL | 11 refills | 30 days
Start: 2021-06-08 — End: 2022-06-08

## 2021-06-09 MED ORDER — SEVELAMER CARBONATE 800 MG TABLET
ORAL_TABLET | Freq: Three times a day (TID) | ORAL | 11 refills | 30 days | Status: CP
Start: 2021-06-09 — End: 2022-06-09

## 2021-06-09 MED ORDER — TACROLIMUS 0.5 MG CAPSULE, IMMEDIATE-RELEASE
ORAL_CAPSULE | ORAL | 3 refills | 120 days | Status: CP
Start: 2021-06-09 — End: ?

## 2021-06-10 DIAGNOSIS — L089 Local infection of the skin and subcutaneous tissue, unspecified: Principal | ICD-10-CM

## 2021-06-10 MED ORDER — DOXYCYCLINE HYCLATE 100 MG CAPSULE
ORAL_CAPSULE | Freq: Two times a day (BID) | ORAL | 0 refills | 30 days | Status: CP
Start: 2021-06-10 — End: ?

## 2021-06-16 ENCOUNTER — Ambulatory Visit: Admit: 2021-06-16 | Discharge: 2021-06-17 | Payer: MEDICARE

## 2021-06-16 DIAGNOSIS — Z85828 Personal history of other malignant neoplasm of skin: Principal | ICD-10-CM

## 2021-06-16 DIAGNOSIS — Z9225 Personal history of immunosupression therapy: Principal | ICD-10-CM

## 2021-06-16 DIAGNOSIS — D492 Neoplasm of unspecified behavior of bone, soft tissue, and skin: Principal | ICD-10-CM

## 2021-06-16 DIAGNOSIS — G8918 Other acute postprocedural pain: Principal | ICD-10-CM

## 2021-06-16 MED ORDER — TRAMADOL 50 MG TABLET
ORAL_TABLET | 0 refills | 0 days | Status: CP
Start: 2021-06-16 — End: ?

## 2021-06-17 DIAGNOSIS — K4090 Unilateral inguinal hernia, without obstruction or gangrene, not specified as recurrent: Principal | ICD-10-CM

## 2021-06-18 ENCOUNTER — Ambulatory Visit: Admit: 2021-06-18 | Discharge: 2021-06-19 | Payer: MEDICARE

## 2021-06-18 DIAGNOSIS — C44622 Squamous cell carcinoma of skin of right upper limb, including shoulder: Principal | ICD-10-CM

## 2021-06-18 DIAGNOSIS — Z5189 Encounter for other specified aftercare: Principal | ICD-10-CM

## 2021-06-24 ENCOUNTER — Ambulatory Visit
Admit: 2021-06-24 | Discharge: 2021-06-25 | Payer: MEDICARE | Attending: MOHS-Micrographic Surgery | Primary: MOHS-Micrographic Surgery

## 2021-06-24 MED ORDER — TRAMADOL 50 MG TABLET
ORAL_TABLET | 0 refills | 0 days | Status: CP
Start: 2021-06-24 — End: ?

## 2021-07-01 MED ORDER — AURYXIA 210 MG IRON TABLET
ORAL_TABLET | Freq: Three times a day (TID) | ORAL | 0 refills | 90 days | Status: CP
Start: 2021-07-01 — End: ?

## 2021-07-02 ENCOUNTER — Ambulatory Visit: Admit: 2021-07-02 | Discharge: 2021-07-02 | Payer: MEDICARE

## 2021-07-02 ENCOUNTER — Ambulatory Visit
Admit: 2021-07-02 | Discharge: 2021-07-02 | Payer: MEDICARE | Attending: MOHS-Micrographic Surgery | Primary: MOHS-Micrographic Surgery

## 2021-07-02 DIAGNOSIS — K4090 Unilateral inguinal hernia, without obstruction or gangrene, not specified as recurrent: Principal | ICD-10-CM

## 2021-07-02 DIAGNOSIS — G4733 Obstructive sleep apnea (adult) (pediatric): Principal | ICD-10-CM

## 2021-07-02 DIAGNOSIS — N185 Chronic kidney disease, stage 5: Principal | ICD-10-CM

## 2021-07-03 DIAGNOSIS — Z942 Lung transplant status: Principal | ICD-10-CM

## 2021-07-03 MED ORDER — TACROLIMUS 0.5 MG CAPSULE, IMMEDIATE-RELEASE
ORAL_CAPSULE | Freq: Two times a day (BID) | ORAL | 3 refills | 90 days | Status: CP
Start: 2021-07-03 — End: 2022-07-03

## 2021-07-09 DIAGNOSIS — Z942 Lung transplant status: Principal | ICD-10-CM

## 2021-07-09 MED ORDER — TACROLIMUS 0.5 MG CAPSULE, IMMEDIATE-RELEASE
ORAL_CAPSULE | Freq: Two times a day (BID) | ORAL | 3 refills | 90 days | Status: CP
Start: 2021-07-09 — End: 2022-07-09

## 2021-07-10 ENCOUNTER — Ambulatory Visit: Admit: 2021-07-10 | Discharge: 2021-07-10 | Payer: MEDICARE

## 2021-07-10 ENCOUNTER — Encounter: Admit: 2021-07-10 | Discharge: 2021-07-10 | Payer: MEDICARE

## 2021-07-10 MED ORDER — TRAMADOL 50 MG TABLET
ORAL_TABLET | Freq: Four times a day (QID) | ORAL | 0 refills | 2.00000 days | Status: CP | PRN
Start: 2021-07-10 — End: ?

## 2021-07-11 MED ORDER — FUROSEMIDE 80 MG TABLET
ORAL_TABLET | 0 refills | 0 days
Start: 2021-07-11 — End: ?

## 2021-07-20 ENCOUNTER — Ambulatory Visit: Admit: 2021-07-20 | Discharge: 2021-07-21 | Payer: MEDICARE | Attending: Surgery | Primary: Surgery

## 2021-07-20 ENCOUNTER — Ambulatory Visit: Admit: 2021-07-20 | Discharge: 2021-07-21 | Payer: MEDICARE

## 2021-07-20 DIAGNOSIS — Z942 Lung transplant status: Principal | ICD-10-CM

## 2021-07-20 MED ORDER — MYCOPHENOLATE SODIUM 180 MG TABLET,DELAYED RELEASE
ORAL_TABLET | Freq: Every day | ORAL | 3 refills | 90 days | Status: CP
Start: 2021-07-20 — End: 2022-07-20

## 2021-07-23 MED ORDER — OMEPRAZOLE 40 MG CAPSULE,DELAYED RELEASE
ORAL_CAPSULE | Freq: Every evening | ORAL | 3 refills | 90.00000 days | Status: CP
Start: 2021-07-23 — End: 2022-07-23

## 2021-07-24 DIAGNOSIS — R944 Abnormal results of kidney function studies: Principal | ICD-10-CM

## 2021-07-24 DIAGNOSIS — Z992 Dependence on renal dialysis: Principal | ICD-10-CM

## 2021-07-24 DIAGNOSIS — Z114 Encounter for screening for human immunodeficiency virus [HIV]: Principal | ICD-10-CM

## 2021-07-24 DIAGNOSIS — Z7289 Other problems related to lifestyle: Principal | ICD-10-CM

## 2021-07-24 DIAGNOSIS — Z01818 Encounter for other preprocedural examination: Principal | ICD-10-CM

## 2021-07-24 DIAGNOSIS — Z7682 Awaiting organ transplant status: Principal | ICD-10-CM

## 2021-07-24 DIAGNOSIS — N186 End stage renal disease: Principal | ICD-10-CM

## 2021-07-24 DIAGNOSIS — Z125 Encounter for screening for malignant neoplasm of prostate: Principal | ICD-10-CM

## 2021-07-24 DIAGNOSIS — Z01810 Encounter for preprocedural cardiovascular examination: Principal | ICD-10-CM

## 2021-07-28 ENCOUNTER — Ambulatory Visit: Admit: 2021-07-28 | Discharge: 2021-07-29 | Payer: MEDICARE

## 2021-07-28 DIAGNOSIS — Z85828 Personal history of other malignant neoplasm of skin: Principal | ICD-10-CM

## 2021-07-28 DIAGNOSIS — D492 Neoplasm of unspecified behavior of bone, soft tissue, and skin: Principal | ICD-10-CM

## 2021-07-30 DIAGNOSIS — C4492 Squamous cell carcinoma of skin, unspecified: Principal | ICD-10-CM

## 2021-07-30 DIAGNOSIS — L570 Actinic keratosis: Principal | ICD-10-CM

## 2021-07-30 DIAGNOSIS — D099 Carcinoma in situ, unspecified: Principal | ICD-10-CM

## 2021-07-30 MED ORDER — FLUOROURACIL 5 % TOPICAL CREAM
Freq: Two times a day (BID) | TOPICAL | 2 refills | 0 days | Status: CP
Start: 2021-07-30 — End: 2022-07-30

## 2021-08-06 ENCOUNTER — Other Ambulatory Visit: Payer: Self-pay | Admitting: *Deleted

## 2021-08-13 ENCOUNTER — Ambulatory Visit
Admit: 2021-08-13 | Discharge: 2021-08-14 | Payer: MEDICARE | Attending: MOHS-Micrographic Surgery | Primary: MOHS-Micrographic Surgery

## 2021-08-14 ENCOUNTER — Encounter: Payer: Self-pay | Admitting: Oncology

## 2021-08-20 DIAGNOSIS — Z942 Lung transplant status: Principal | ICD-10-CM

## 2021-08-20 DIAGNOSIS — Z79899 Other long term (current) drug therapy: Principal | ICD-10-CM

## 2021-08-28 ENCOUNTER — Encounter: Admit: 2021-08-28 | Discharge: 2021-08-28 | Payer: MEDICARE | Attending: Internal Medicine | Primary: Internal Medicine

## 2021-08-28 ENCOUNTER — Ambulatory Visit: Admit: 2021-08-28 | Discharge: 2021-08-28 | Payer: MEDICARE

## 2021-08-28 ENCOUNTER — Ambulatory Visit: Admit: 2021-08-28 | Discharge: 2021-08-28 | Payer: MEDICARE | Attending: Internal Medicine | Primary: Internal Medicine

## 2021-08-28 DIAGNOSIS — Z942 Lung transplant status: Principal | ICD-10-CM

## 2021-08-28 MED ORDER — TACROLIMUS 1 MG CAPSULE, IMMEDIATE-RELEASE
ORAL_CAPSULE | Freq: Two times a day (BID) | ORAL | 3 refills | 90 days | Status: CP
Start: 2021-08-28 — End: 2022-08-28

## 2021-09-04 MED ORDER — FUROSEMIDE 80 MG TABLET
ORAL_TABLET | ORAL | 0 refills | 0.00000 days | Status: CP
Start: 2021-09-04 — End: ?

## 2021-09-15 ENCOUNTER — Ambulatory Visit: Admit: 2021-09-15 | Discharge: 2021-09-16 | Payer: MEDICARE

## 2021-09-15 DIAGNOSIS — D485 Neoplasm of uncertain behavior of skin: Principal | ICD-10-CM

## 2021-09-15 DIAGNOSIS — Z85828 Personal history of other malignant neoplasm of skin: Principal | ICD-10-CM

## 2021-09-15 DIAGNOSIS — L570 Actinic keratosis: Principal | ICD-10-CM

## 2021-09-15 DIAGNOSIS — L821 Other seborrheic keratosis: Principal | ICD-10-CM

## 2021-09-15 DIAGNOSIS — L814 Other melanin hyperpigmentation: Principal | ICD-10-CM

## 2021-09-15 DIAGNOSIS — L578 Other skin changes due to chronic exposure to nonionizing radiation: Principal | ICD-10-CM

## 2021-10-08 DIAGNOSIS — Z942 Lung transplant status: Principal | ICD-10-CM

## 2021-10-08 MED ORDER — AMLODIPINE 5 MG TABLET
ORAL_TABLET | Freq: Every evening | ORAL | 3 refills | 90 days | Status: CP
Start: 2021-10-08 — End: 2022-10-08

## 2021-10-08 MED ORDER — AZITHROMYCIN 250 MG TABLET
ORAL_TABLET | 3 refills | 0 days | Status: CP
Start: 2021-10-08 — End: ?

## 2021-11-01 DIAGNOSIS — D801 Nonfamilial hypogammaglobulinemia: Principal | ICD-10-CM

## 2021-11-01 DIAGNOSIS — Z942 Lung transplant status: Principal | ICD-10-CM

## 2021-11-09 MED ORDER — TRAZODONE 50 MG TABLET
ORAL_TABLET | Freq: Every evening | ORAL | 3 refills | 90 days | Status: CP
Start: 2021-11-09 — End: 2022-11-09

## 2021-11-17 DIAGNOSIS — Z942 Lung transplant status: Principal | ICD-10-CM

## 2021-11-17 MED ORDER — TACROLIMUS 0.5 MG CAPSULE, IMMEDIATE-RELEASE
ORAL_CAPSULE | Freq: Two times a day (BID) | ORAL | 3 refills | 90 days | Status: CP
Start: 2021-11-17 — End: 2022-11-17

## 2021-11-17 MED ORDER — TACROLIMUS 1 MG CAPSULE, IMMEDIATE-RELEASE
ORAL_CAPSULE | Freq: Two times a day (BID) | ORAL | 3 refills | 90 days | Status: CP
Start: 2021-11-17 — End: 2022-11-17

## 2021-11-29 DIAGNOSIS — Z942 Lung transplant status: Principal | ICD-10-CM

## 2021-11-29 DIAGNOSIS — D801 Nonfamilial hypogammaglobulinemia: Principal | ICD-10-CM

## 2021-12-22 ENCOUNTER — Ambulatory Visit: Admit: 2021-12-22 | Discharge: 2021-12-23 | Payer: MEDICARE

## 2021-12-22 DIAGNOSIS — L570 Actinic keratosis: Principal | ICD-10-CM

## 2021-12-22 DIAGNOSIS — L814 Other melanin hyperpigmentation: Principal | ICD-10-CM

## 2021-12-22 DIAGNOSIS — Z85828 Personal history of other malignant neoplasm of skin: Principal | ICD-10-CM

## 2021-12-22 DIAGNOSIS — D485 Neoplasm of uncertain behavior of skin: Principal | ICD-10-CM

## 2021-12-25 MED ORDER — GENTAMICIN 0.1 % TOPICAL CREAM
0 refills | 0 days
Start: 2021-12-25 — End: ?

## 2021-12-27 DIAGNOSIS — Z942 Lung transplant status: Principal | ICD-10-CM

## 2021-12-27 DIAGNOSIS — D801 Nonfamilial hypogammaglobulinemia: Principal | ICD-10-CM

## 2021-12-28 ENCOUNTER — Ambulatory Visit: Admit: 2021-12-28 | Discharge: 2021-12-29 | Payer: MEDICARE | Attending: Surgery | Primary: Surgery

## 2021-12-28 MED ORDER — GENTAMICIN 0.1 % TOPICAL CREAM
0 refills | 0 days
Start: 2021-12-28 — End: ?

## 2022-01-01 ENCOUNTER — Encounter: Admit: 2022-01-01 | Discharge: 2022-01-02 | Payer: MEDICARE | Attending: Internal Medicine | Primary: Internal Medicine

## 2022-01-01 ENCOUNTER — Ambulatory Visit: Admit: 2022-01-01 | Discharge: 2022-01-02 | Payer: MEDICARE

## 2022-01-01 ENCOUNTER — Ambulatory Visit: Admit: 2022-01-01 | Discharge: 2022-01-02 | Payer: MEDICARE | Attending: Internal Medicine | Primary: Internal Medicine

## 2022-01-01 MED ORDER — ATORVASTATIN 80 MG TABLET
ORAL_TABLET | Freq: Every day | ORAL | 3 refills | 90 days | Status: CP
Start: 2022-01-01 — End: 2023-01-01

## 2022-01-01 MED ORDER — SERTRALINE 100 MG TABLET
ORAL_TABLET | Freq: Every day | ORAL | 3 refills | 90 days | Status: CP
Start: 2022-01-01 — End: ?

## 2022-01-01 MED ORDER — CALCIUM CARBONATE 200 MG CALCIUM (500 MG) CHEWABLE TABLET
ORAL_TABLET | Freq: Three times a day (TID) | ORAL | 11 refills | 30 days | Status: CP
Start: 2022-01-01 — End: 2023-01-01

## 2022-01-01 MED ORDER — TAMSULOSIN 0.4 MG CAPSULE
ORAL_CAPSULE | Freq: Every day | ORAL | 3 refills | 90 days | Status: CP
Start: 2022-01-01 — End: 2023-01-01

## 2022-01-04 MED ORDER — POTASSIUM CHLORIDE ER 20 MEQ TABLET,EXTENDED RELEASE(PART/CRYST)
ORAL_TABLET | Freq: Every day | ORAL | 0 refills | 90 days | Status: CP
Start: 2022-01-04 — End: ?

## 2022-01-06 MED ORDER — SERTRALINE 25 MG TABLET
ORAL_TABLET | ORAL | 3 refills | 90 days | Status: CP
Start: 2022-01-06 — End: 2023-01-06

## 2022-01-08 ENCOUNTER — Ambulatory Visit: Admit: 2022-01-08 | Discharge: 2022-01-09 | Payer: MEDICARE

## 2022-01-08 DIAGNOSIS — Z942 Lung transplant status: Principal | ICD-10-CM

## 2022-01-08 DIAGNOSIS — D801 Nonfamilial hypogammaglobulinemia: Principal | ICD-10-CM

## 2022-01-11 ENCOUNTER — Ambulatory Visit: Admit: 2022-01-11 | Discharge: 2022-01-13 | Payer: MEDICARE

## 2022-01-11 ENCOUNTER — Ambulatory Visit: Admit: 2022-01-11 | Discharge: 2022-01-11 | Payer: MEDICARE

## 2022-01-13 ENCOUNTER — Ambulatory Visit: Admit: 2022-01-13 | Discharge: 2022-01-14 | Payer: MEDICARE

## 2022-01-13 DIAGNOSIS — J90 Pleural effusion, not elsewhere classified: Principal | ICD-10-CM

## 2022-01-13 DIAGNOSIS — R634 Abnormal weight loss: Principal | ICD-10-CM

## 2022-01-13 DIAGNOSIS — C44321 Squamous cell carcinoma of skin of nose: Principal | ICD-10-CM

## 2022-01-14 DIAGNOSIS — K769 Liver disease, unspecified: Principal | ICD-10-CM

## 2022-01-15 ENCOUNTER — Ambulatory Visit: Admit: 2022-01-15 | Discharge: 2022-01-15 | Payer: MEDICARE

## 2022-01-15 DIAGNOSIS — K769 Liver disease, unspecified: Principal | ICD-10-CM

## 2022-01-18 ENCOUNTER — Institutional Professional Consult (permissible substitution): Admit: 2022-01-18 | Discharge: 2022-01-19 | Payer: MEDICARE

## 2022-01-19 ENCOUNTER — Ambulatory Visit: Admit: 2022-01-19 | Discharge: 2022-01-20 | Payer: MEDICARE

## 2022-01-19 MED ORDER — HYDROCODONE 5 MG-ACETAMINOPHEN 325 MG TABLET
ORAL_TABLET | Freq: Four times a day (QID) | ORAL | 0 refills | 3 days | Status: CP | PRN
Start: 2022-01-19 — End: ?

## 2022-01-21 ENCOUNTER — Ambulatory Visit: Admit: 2022-01-21 | Discharge: 2022-01-22 | Payer: MEDICARE | Attending: Surgery | Primary: Surgery

## 2022-01-26 ENCOUNTER — Ambulatory Visit: Admit: 2022-01-26 | Discharge: 2022-01-27 | Payer: MEDICARE

## 2022-01-28 ENCOUNTER — Telehealth: Admit: 2022-01-28 | Discharge: 2022-01-29 | Payer: MEDICARE | Attending: Clinical | Primary: Clinical

## 2022-01-28 DIAGNOSIS — F411 Generalized anxiety disorder: Principal | ICD-10-CM

## 2022-02-02 ENCOUNTER — Ambulatory Visit: Admit: 2022-02-02 | Discharge: 2022-02-03 | Payer: MEDICARE

## 2022-02-08 ENCOUNTER — Ambulatory Visit: Admit: 2022-02-08 | Discharge: 2022-02-09 | Payer: MEDICARE

## 2022-02-08 ENCOUNTER — Encounter: Admit: 2022-02-08 | Discharge: 2022-02-08 | Payer: MEDICARE | Attending: Surgery | Primary: Surgery

## 2022-02-08 DIAGNOSIS — R5381 Other malaise: Principal | ICD-10-CM

## 2022-02-17 ENCOUNTER — Ambulatory Visit: Admit: 2022-02-17 | Discharge: 2022-02-18 | Payer: MEDICARE

## 2022-02-18 ENCOUNTER — Telehealth: Admit: 2022-02-18 | Discharge: 2022-02-19 | Payer: MEDICARE | Attending: Clinical | Primary: Clinical

## 2022-03-04 ENCOUNTER — Telehealth: Admit: 2022-03-04 | Discharge: 2022-03-05 | Payer: MEDICARE | Attending: Clinical | Primary: Clinical

## 2022-03-04 DIAGNOSIS — F411 Generalized anxiety disorder: Principal | ICD-10-CM

## 2022-03-04 DIAGNOSIS — F321 Major depressive disorder, single episode, moderate: Principal | ICD-10-CM

## 2022-03-11 ENCOUNTER — Ambulatory Visit: Admit: 2022-03-11 | Discharge: 2022-03-12 | Payer: MEDICARE

## 2022-03-16 MED ORDER — POTASSIUM CHLORIDE ER 20 MEQ TABLET,EXTENDED RELEASE(PART/CRYST)
ORAL_TABLET | Freq: Every day | ORAL | 0 refills | 0 days
Start: 2022-03-16 — End: ?

## 2022-03-17 MED ORDER — POTASSIUM CHLORIDE ER 20 MEQ TABLET,EXTENDED RELEASE(PART/CRYST)
ORAL_TABLET | Freq: Every day | ORAL | 0 refills | 90 days | Status: CP
Start: 2022-03-17 — End: ?

## 2022-04-07 ENCOUNTER — Institutional Professional Consult (permissible substitution): Admit: 2022-04-07 | Discharge: 2022-04-08 | Payer: MEDICARE | Attending: Clinical | Primary: Clinical

## 2022-04-19 DIAGNOSIS — D099 Carcinoma in situ, unspecified: Principal | ICD-10-CM

## 2022-04-19 DIAGNOSIS — L570 Actinic keratosis: Principal | ICD-10-CM

## 2022-04-19 MED ORDER — FLUOROURACIL 5 % TOPICAL CREAM
Freq: Two times a day (BID) | TOPICAL | 2 refills | 0.00000 days
Start: 2022-04-19 — End: 2023-04-19

## 2022-04-21 MED ORDER — FLUOROURACIL 5 % TOPICAL CREAM
Freq: Two times a day (BID) | TOPICAL | 2 refills | 0.00000 days | Status: CP
Start: 2022-04-21 — End: ?

## 2022-04-23 ENCOUNTER — Ambulatory Visit: Admit: 2022-04-23 | Discharge: 2022-04-24 | Payer: MEDICARE

## 2022-04-23 ENCOUNTER — Encounter: Admit: 2022-04-23 | Discharge: 2022-04-24 | Payer: MEDICARE | Attending: Internal Medicine | Primary: Internal Medicine

## 2022-04-23 ENCOUNTER — Ambulatory Visit: Admit: 2022-04-23 | Discharge: 2022-04-24 | Payer: MEDICARE | Attending: Family | Primary: Family

## 2022-04-23 DIAGNOSIS — K21 Gastroesophageal reflux disease with esophagitis without hemorrhage: Principal | ICD-10-CM

## 2022-04-23 MED ORDER — TRAZODONE 50 MG TABLET
ORAL_TABLET | Freq: Every evening | ORAL | 3 refills | 90 days | Status: CP
Start: 2022-04-23 — End: 2023-04-23

## 2022-04-23 MED ORDER — OMEPRAZOLE 40 MG CAPSULE,DELAYED RELEASE
ORAL_CAPSULE | Freq: Two times a day (BID) | ORAL | 3 refills | 90 days | Status: CP
Start: 2022-04-23 — End: 2023-04-23

## 2022-04-23 MED ORDER — GAVISCON 95 MG-358 MG/15 ML ORAL SUSPENSION
Freq: Four times a day (QID) | ORAL | 11 refills | 30 days | Status: CP
Start: 2022-04-23 — End: 2023-04-23

## 2022-04-27 ENCOUNTER — Ambulatory Visit: Admit: 2022-04-27 | Discharge: 2022-04-28 | Payer: MEDICARE

## 2022-04-27 DIAGNOSIS — Z9225 Personal history of immunosupression therapy: Principal | ICD-10-CM

## 2022-04-27 DIAGNOSIS — D492 Neoplasm of unspecified behavior of bone, soft tissue, and skin: Principal | ICD-10-CM

## 2022-04-27 DIAGNOSIS — Z85828 Personal history of other malignant neoplasm of skin: Principal | ICD-10-CM

## 2022-04-27 DIAGNOSIS — L821 Other seborrheic keratosis: Principal | ICD-10-CM

## 2022-05-05 ENCOUNTER — Institutional Professional Consult (permissible substitution): Admit: 2022-05-05 | Discharge: 2022-05-06 | Payer: MEDICARE | Attending: Clinical | Primary: Clinical

## 2022-05-12 ENCOUNTER — Institutional Professional Consult (permissible substitution): Admit: 2022-05-12 | Discharge: 2022-05-13 | Payer: MEDICARE | Attending: Clinical | Primary: Clinical

## 2022-05-13 ENCOUNTER — Ambulatory Visit: Admit: 2022-05-13 | Payer: MEDICARE

## 2022-05-13 DIAGNOSIS — Z942 Lung transplant status: Principal | ICD-10-CM

## 2022-05-26 ENCOUNTER — Institutional Professional Consult (permissible substitution): Admit: 2022-05-26 | Discharge: 2022-05-27 | Payer: MEDICARE | Attending: Clinical | Primary: Clinical

## 2022-06-09 ENCOUNTER — Institutional Professional Consult (permissible substitution): Admit: 2022-06-09 | Payer: MEDICARE | Attending: Clinical | Primary: Clinical

## 2022-06-11 DIAGNOSIS — Z942 Lung transplant status: Principal | ICD-10-CM

## 2022-06-11 MED ORDER — SULFAMETHOXAZOLE 400 MG-TRIMETHOPRIM 80 MG TABLET
ORAL_TABLET | ORAL | 3 refills | 84 days | Status: CP
Start: 2022-06-11 — End: 2023-06-11

## 2022-06-22 ENCOUNTER — Ambulatory Visit: Admit: 2022-06-22 | Discharge: 2022-06-22 | Payer: MEDICARE

## 2022-06-23 ENCOUNTER — Institutional Professional Consult (permissible substitution): Admit: 2022-06-23 | Discharge: 2022-06-24 | Payer: MEDICARE | Attending: Clinical | Primary: Clinical

## 2022-06-24 DIAGNOSIS — Z9889 Other specified postprocedural states: Principal | ICD-10-CM

## 2022-07-14 ENCOUNTER — Institutional Professional Consult (permissible substitution): Admit: 2022-07-14 | Discharge: 2022-07-15 | Payer: MEDICARE | Attending: Clinical | Primary: Clinical

## 2022-07-19 ENCOUNTER — Encounter
Admit: 2022-07-19 | Discharge: 2022-07-19 | Payer: MEDICARE | Attending: Critical Care Medicine | Primary: Critical Care Medicine

## 2022-07-19 ENCOUNTER — Ambulatory Visit: Admit: 2022-07-19 | Discharge: 2022-07-19 | Payer: MEDICARE

## 2022-07-26 MED ORDER — TRAZODONE 100 MG TABLET
ORAL_TABLET | Freq: Every evening | ORAL | 3 refills | 90 days | Status: CP
Start: 2022-07-26 — End: 2023-07-26

## 2022-07-29 MED ORDER — OMEPRAZOLE 40 MG CAPSULE,DELAYED RELEASE
ORAL_CAPSULE | Freq: Every evening | ORAL | 0 refills | 90 days | Status: CP
Start: 2022-07-29 — End: ?

## 2022-08-31 ENCOUNTER — Ambulatory Visit: Admit: 2022-08-31 | Discharge: 2022-09-01 | Payer: MEDICARE

## 2022-09-06 DIAGNOSIS — C4402 Squamous cell carcinoma of skin of lip: Principal | ICD-10-CM

## 2022-09-06 DIAGNOSIS — C44329 Squamous cell carcinoma of skin of other parts of face: Principal | ICD-10-CM

## 2022-09-10 DIAGNOSIS — Z942 Lung transplant status: Principal | ICD-10-CM

## 2022-09-17 MED ORDER — AMLODIPINE 5 MG TABLET
ORAL_TABLET | Freq: Every day | ORAL | 3 refills | 90 days | Status: CP
Start: 2022-09-17 — End: ?

## 2022-09-20 ENCOUNTER — Ambulatory Visit: Admit: 2022-09-20 | Payer: MEDICARE | Attending: Nephrology | Primary: Nephrology

## 2022-09-21 ENCOUNTER — Ambulatory Visit: Admit: 2022-09-21 | Discharge: 2022-09-22 | Payer: MEDICARE

## 2022-09-21 ENCOUNTER — Ambulatory Visit: Admit: 2022-09-21 | Discharge: 2022-09-22 | Payer: MEDICARE | Attending: Internal Medicine | Primary: Internal Medicine

## 2022-09-21 DIAGNOSIS — Z48298 Encounter for aftercare following other organ transplant: Principal | ICD-10-CM

## 2022-09-21 MED ORDER — MIRTAZAPINE 15 MG TABLET
ORAL_TABLET | Freq: Every evening | ORAL | 3 refills | 90 days | Status: CP
Start: 2022-09-21 — End: 2023-09-21

## 2022-09-27 ENCOUNTER — Telehealth: Admit: 2022-09-27 | Discharge: 2022-09-28 | Payer: MEDICARE | Attending: Registered" | Primary: Registered"

## 2022-10-06 MED ORDER — GUAIFENESIN ER 600 MG TABLET, EXTENDED RELEASE 12 HR
ORAL_TABLET | Freq: Every day | ORAL | 2 refills | 15 days | Status: CP | PRN
Start: 2022-10-06 — End: 2022-11-20

## 2022-10-07 ENCOUNTER — Ambulatory Visit: Admit: 2022-10-07 | Discharge: 2022-10-07 | Payer: MEDICARE

## 2022-10-08 DIAGNOSIS — Z942 Lung transplant status: Principal | ICD-10-CM

## 2022-10-08 MED ORDER — ONDANSETRON HCL 4 MG TABLET
ORAL_TABLET | Freq: Every day | ORAL | 1 refills | 30 days | Status: CP | PRN
Start: 2022-10-08 — End: 2022-11-07

## 2022-10-11 DIAGNOSIS — Z01818 Encounter for other preprocedural examination: Principal | ICD-10-CM

## 2022-10-11 DIAGNOSIS — Z7682 Awaiting organ transplant status: Principal | ICD-10-CM

## 2022-10-14 ENCOUNTER — Ambulatory Visit: Admit: 2022-10-14 | Payer: MEDICARE

## 2022-10-16 MED ORDER — OMEPRAZOLE 40 MG CAPSULE,DELAYED RELEASE
ORAL_CAPSULE | Freq: Every evening | ORAL | 0 refills | 90 days | Status: CP
Start: 2022-10-16 — End: ?

## 2022-10-18 MED ORDER — TRAZODONE 100 MG TABLET
ORAL_TABLET | Freq: Every evening | ORAL | 3 refills | 90 days | Status: CP
Start: 2022-10-18 — End: 2023-10-18

## 2022-10-18 MED ORDER — OMEPRAZOLE 40 MG CAPSULE,DELAYED RELEASE
ORAL_CAPSULE | Freq: Every evening | ORAL | 0 refills | 90 days | Status: CP
Start: 2022-10-18 — End: ?

## 2022-10-18 MED ORDER — GABAPENTIN 100 MG CAPSULE
ORAL_CAPSULE | Freq: Three times a day (TID) | ORAL | 3 refills | 90 days | Status: CP
Start: 2022-10-18 — End: 2023-10-18

## 2022-10-22 ENCOUNTER — Ambulatory Visit: Admit: 2022-10-22 | Discharge: 2022-10-23 | Payer: MEDICARE

## 2022-11-04 ENCOUNTER — Institutional Professional Consult (permissible substitution): Admit: 2022-11-04 | Discharge: 2022-11-05 | Payer: MEDICARE | Attending: Clinical | Primary: Clinical

## 2022-11-09 DIAGNOSIS — R531 Weakness: Principal | ICD-10-CM

## 2022-11-09 MED ORDER — ONDANSETRON HCL 4 MG TABLET
ORAL_TABLET | 1 refills | 0 days | Status: CP
Start: 2022-11-09 — End: ?

## 2022-11-10 DIAGNOSIS — J860 Pyothorax with fistula: Principal | ICD-10-CM

## 2022-11-11 ENCOUNTER — Telehealth: Admit: 2022-11-11 | Payer: MEDICARE | Attending: Clinical | Primary: Clinical

## 2022-11-16 DIAGNOSIS — Z942 Lung transplant status: Principal | ICD-10-CM

## 2022-11-16 MED ORDER — TACROLIMUS 1 MG CAPSULE, IMMEDIATE-RELEASE
ORAL_CAPSULE | 3 refills | 0 days | Status: CP
Start: 2022-11-16 — End: ?

## 2022-11-18 ENCOUNTER — Ambulatory Visit: Admit: 2022-11-18 | Payer: MEDICARE | Attending: Clinical | Primary: Clinical

## 2022-11-25 ENCOUNTER — Telehealth: Admit: 2022-11-25 | Payer: MEDICARE | Attending: Clinical | Primary: Clinical

## 2022-11-26 DIAGNOSIS — Z942 Lung transplant status: Principal | ICD-10-CM

## 2022-11-26 MED ORDER — AZITHROMYCIN 250 MG TABLET
ORAL_TABLET | 3 refills | 0 days | Status: CP
Start: 2022-11-26 — End: ?

## 2022-12-02 ENCOUNTER — Telehealth: Admit: 2022-12-02 | Discharge: 2022-12-03 | Payer: MEDICARE | Attending: Clinical | Primary: Clinical

## 2022-12-06 DIAGNOSIS — J90 Pleural effusion, not elsewhere classified: Principal | ICD-10-CM

## 2022-12-16 ENCOUNTER — Institutional Professional Consult (permissible substitution): Admit: 2022-12-16 | Discharge: 2022-12-17 | Payer: MEDICARE | Attending: Clinical | Primary: Clinical

## 2022-12-23 ENCOUNTER — Telehealth: Admit: 2022-12-23 | Discharge: 2022-12-24 | Payer: MEDICARE | Attending: Clinical | Primary: Clinical

## 2022-12-30 ENCOUNTER — Telehealth: Admit: 2022-12-30 | Payer: MEDICARE | Attending: Clinical | Primary: Clinical

## 2023-01-06 ENCOUNTER — Telehealth: Admit: 2023-01-06 | Payer: MEDICARE | Attending: Clinical | Primary: Clinical

## 2023-01-13 ENCOUNTER — Telehealth: Admit: 2023-01-13 | Payer: MEDICARE | Attending: Clinical | Primary: Clinical

## 2023-01-14 MED ORDER — ATORVASTATIN 80 MG TABLET
ORAL_TABLET | ORAL | 3 refills | 90 days | Status: CP
Start: 2023-01-14 — End: ?

## 2023-01-19 DIAGNOSIS — Z942 Lung transplant status: Principal | ICD-10-CM

## 2023-01-19 DIAGNOSIS — Z79899 Other long term (current) drug therapy: Principal | ICD-10-CM

## 2023-01-19 MED ORDER — OMEPRAZOLE 40 MG CAPSULE,DELAYED RELEASE
ORAL_CAPSULE | Freq: Every evening | ORAL | 0 refills | 90 days | Status: CP
Start: 2023-01-19 — End: ?

## 2023-01-20 ENCOUNTER — Institutional Professional Consult (permissible substitution): Admit: 2023-01-20 | Discharge: 2023-01-21 | Payer: MEDICARE | Attending: Clinical | Primary: Clinical

## 2023-01-24 DIAGNOSIS — Z942 Lung transplant status: Principal | ICD-10-CM

## 2023-01-24 MED ORDER — SERTRALINE 100 MG TABLET
ORAL_TABLET | Freq: Every day | ORAL | 3 refills | 90 days | Status: CP
Start: 2023-01-24 — End: ?

## 2023-01-24 MED ORDER — TACROLIMUS 0.5 MG CAPSULE, IMMEDIATE-RELEASE
ORAL_CAPSULE | 3 refills | 0 days | Status: CP
Start: 2023-01-24 — End: ?

## 2023-01-25 MED ORDER — MORPHINE 10 MG/5 ML ORAL SOLUTION
ORAL | 0 refills | 4 days | Status: CP | PRN
Start: 2023-01-25 — End: 2023-02-01

## 2023-01-26 MED ORDER — DULOXETINE 20 MG CAPSULE,DELAYED RELEASE
ORAL_CAPSULE | Freq: Every day | ORAL | 3 refills | 90 days | Status: CP
Start: 2023-01-26 — End: 2024-01-26

## 2023-01-26 MED ORDER — SERTRALINE 50 MG TABLET
ORAL_TABLET | Freq: Every day | ORAL | 3 refills | 90 days | Status: CP
Start: 2023-01-26 — End: 2024-01-26

## 2023-01-27 ENCOUNTER — Institutional Professional Consult (permissible substitution): Admit: 2023-01-27 | Payer: MEDICARE | Attending: Clinical | Primary: Clinical

## 2023-01-31 MED ORDER — ONDANSETRON HCL 4 MG TABLET
ORAL_TABLET | Freq: Three times a day (TID) | ORAL | 1 refills | 10 days | Status: CP | PRN
Start: 2023-01-31 — End: ?

## 2023-02-03 ENCOUNTER — Institutional Professional Consult (permissible substitution): Admit: 2023-02-03 | Discharge: 2023-02-04 | Payer: MEDICARE | Attending: Clinical | Primary: Clinical

## 2023-03-23 DEATH — deceased
